# Patient Record
Sex: Female | Born: 1970 | Race: White | Hispanic: No | Marital: Married | Smoking: Never smoker
Health system: Southern US, Community
[De-identification: ages and names within clinical notes are randomized; demographics above are authoritative.]

## PROBLEM LIST (undated history)

## (undated) DIAGNOSIS — I10 Essential (primary) hypertension: Secondary | ICD-10-CM

## (undated) HISTORY — DX: Essential (primary) hypertension: I10

## (undated) HISTORY — PX: KIDNEY SURGERY: SHX687

---

## 1998-02-09 ENCOUNTER — Other Ambulatory Visit: Admission: RE | Admit: 1998-02-09 | Discharge: 1998-02-09 | Payer: Self-pay | Admitting: Obstetrics and Gynecology

## 1999-01-11 ENCOUNTER — Other Ambulatory Visit: Admission: RE | Admit: 1999-01-11 | Discharge: 1999-01-11 | Payer: Self-pay | Admitting: Obstetrics and Gynecology

## 1999-09-09 ENCOUNTER — Inpatient Hospital Stay (HOSPITAL_COMMUNITY): Admission: AD | Admit: 1999-09-09 | Discharge: 1999-09-09 | Payer: Self-pay | Admitting: Obstetrics and Gynecology

## 1999-09-11 ENCOUNTER — Encounter (INDEPENDENT_AMBULATORY_CARE_PROVIDER_SITE_OTHER): Payer: Self-pay | Admitting: Specialist

## 1999-09-11 ENCOUNTER — Inpatient Hospital Stay (HOSPITAL_COMMUNITY): Admission: AD | Admit: 1999-09-11 | Discharge: 1999-09-15 | Payer: Self-pay | Admitting: Obstetrics and Gynecology

## 1999-09-16 ENCOUNTER — Encounter: Admission: RE | Admit: 1999-09-16 | Discharge: 1999-12-15 | Payer: Self-pay | Admitting: Gynecology

## 1999-11-08 ENCOUNTER — Other Ambulatory Visit: Admission: RE | Admit: 1999-11-08 | Discharge: 1999-11-08 | Payer: Self-pay | Admitting: Gynecology

## 2000-12-06 ENCOUNTER — Other Ambulatory Visit: Admission: RE | Admit: 2000-12-06 | Discharge: 2000-12-06 | Payer: Self-pay | Admitting: Obstetrics and Gynecology

## 2001-07-25 ENCOUNTER — Other Ambulatory Visit: Admission: RE | Admit: 2001-07-25 | Discharge: 2001-07-25 | Payer: Self-pay | Admitting: Gynecology

## 2001-07-30 ENCOUNTER — Encounter: Admission: RE | Admit: 2001-07-30 | Discharge: 2001-10-28 | Payer: Self-pay | Admitting: Gynecology

## 2002-02-12 ENCOUNTER — Inpatient Hospital Stay (HOSPITAL_COMMUNITY): Admission: AD | Admit: 2002-02-12 | Discharge: 2002-02-14 | Payer: Self-pay | Admitting: Internal Medicine

## 2002-02-16 ENCOUNTER — Encounter: Admission: RE | Admit: 2002-02-16 | Discharge: 2002-03-18 | Payer: Self-pay | Admitting: Gynecology

## 2002-03-19 ENCOUNTER — Encounter: Admission: RE | Admit: 2002-03-19 | Discharge: 2002-04-18 | Payer: Self-pay | Admitting: Gynecology

## 2002-03-24 ENCOUNTER — Other Ambulatory Visit: Admission: RE | Admit: 2002-03-24 | Discharge: 2002-03-24 | Payer: Self-pay | Admitting: *Deleted

## 2003-11-03 ENCOUNTER — Other Ambulatory Visit: Admission: RE | Admit: 2003-11-03 | Discharge: 2003-11-03 | Payer: Self-pay | Admitting: Obstetrics and Gynecology

## 2004-11-10 ENCOUNTER — Other Ambulatory Visit: Admission: RE | Admit: 2004-11-10 | Discharge: 2004-11-10 | Payer: Self-pay | Admitting: Obstetrics and Gynecology

## 2005-11-15 ENCOUNTER — Other Ambulatory Visit: Admission: RE | Admit: 2005-11-15 | Discharge: 2005-11-15 | Payer: Self-pay | Admitting: Obstetrics and Gynecology

## 2006-11-18 ENCOUNTER — Other Ambulatory Visit: Admission: RE | Admit: 2006-11-18 | Discharge: 2006-11-18 | Payer: Self-pay | Admitting: Obstetrics and Gynecology

## 2007-11-25 ENCOUNTER — Other Ambulatory Visit: Admission: RE | Admit: 2007-11-25 | Discharge: 2007-11-25 | Payer: Self-pay | Admitting: Obstetrics and Gynecology

## 2008-12-14 ENCOUNTER — Ambulatory Visit: Payer: Self-pay | Admitting: Obstetrics and Gynecology

## 2008-12-14 ENCOUNTER — Encounter: Payer: Self-pay | Admitting: Obstetrics and Gynecology

## 2008-12-14 ENCOUNTER — Other Ambulatory Visit: Admission: RE | Admit: 2008-12-14 | Discharge: 2008-12-14 | Payer: Self-pay | Admitting: Obstetrics and Gynecology

## 2009-12-20 ENCOUNTER — Ambulatory Visit: Payer: Self-pay | Admitting: Obstetrics and Gynecology

## 2009-12-20 ENCOUNTER — Other Ambulatory Visit: Admission: RE | Admit: 2009-12-20 | Discharge: 2009-12-20 | Payer: Self-pay | Admitting: Obstetrics and Gynecology

## 2010-11-24 NOTE — H&P (Signed)
   Teresa Fitzpatrick, Teresa Fitzpatrick                        ACCOUNT NO.:  [id number]   MEDICAL RECORD NO.:  [id number]                   PATIENT TYPE:  INP   LOCATION:  9161                                 FACILITY:  [facility]   PHYSICIAN:  Devin M. Ciliberti, M.D.            DATE OF BIRTH:  03/21/1971   DATE OF ADMISSION:  02/12/2002  DATE OF DISCHARGE:                                HISTORY & PHYSICAL   HISTORY OF PRESENT ILLNESS:  The patient is a 40 year old G3 P1 at 54 and  six-sevenths weeks who presents to triage in labor.  The patient was noted  to be 5 cm, ruptured, and contracting every two minutes.  The patient has a  history of chronic hypertension, was on Aldomet 500 mg t.i.d. and has been  monitored with stress tests and fluid checks throughout her third trimester  with good results.   PAST MEDICAL HISTORY:  Significant for reimplantation of her ureters at age  [age], and hypertension.   PAST SURGICAL HISTORY:  Ureteral implantation, D&C, and C section for  failure to progress.   MEDICATIONS:  Prenatal vitamins.   ALLERGIES:  No known drug allergies   SOCIAL HISTORY:  Denied any alcohol, tobacco, or other drugs.   FAMILY HISTORY:  Without any mental retardation, epithelial cancers.   PHYSICAL EXAMINATION:  VITAL SIGNS:  Blood pressure 120/78.   HEENT:  Throat clear.   LUNGS:  Clear to auscultation bilaterally.   HEART:  Regular rate and rhythm.   ABDOMEN:  Gravid and nontender.  Estimated fetal weight 8 pounds 1 ounce.   PELVIC:  Cervix complete, +1.  The patient has started pushing.   LABORATORY DATA:  GBS is positive.  Will start clindamycin 900 mg IV q.8h.                                               Devin M. Ciliberti, M.D.    DMC/MEDQ  D:  02/12/2002  T:  02/12/2002  Job:  [id number]

## 2010-11-24 NOTE — Op Note (Signed)
[facility]  Patient:    Teresa Fitzpatrick, Teresa Fitzpatrick                       MRN: [id number] Proc. Date: 09/12/99 Adm. Date:  [id number] Attending:  Merrily Pew                           Operative Report  PREOPERATIVE DIAGNOSIS:       Term intrauterine pregnancy.  Polyhydramnios. Chronic hypertension with superimposed preeclampsia.  Arrest of first stage of labor.  Persistent low fetal heart rate.  History of duplicating left collecting system and also history of bilateral ureteral reimplantations.  POSTOPERATIVE DIAGNOSIS:      Term intrauterine pregnancy.  Polyhydramnios. Chronic hypertension with superimposed preeclampsia.  Arrest of first stage of labor.  Persistent low fetal heart rate.  History of duplicating left collecting system and also history of bilateral ureteral reimplantations.  OPERATION:                    Primary low transverse cesarean section.  SURGEON:                      Juan H. Lily Peer, M.D.  ASSISTANT:                    Douglass Rivers, M.D.  ANESTHESIA:                   Epidural.  FINDINGS:                     Clear amniotic fluid.  Viable female infant, Apgars 9 and 9, with arterial cord pH of 7.25, with a weight of 8 pounds 14 ounces.  ESTIMATED BLOOD LOSS:  INDICATIONS:                  A 40 year old, gravida 2, para 0, abortus 1, with  chronic hypertension with superimposed preeclampsia, polyhydramnios, at term with arrest of first stage of labor, and a nonreassuring fetal heart rate tracing (low fetal heart rate baseline 105 to 110 beat per minute range).  DESCRIPTION OF PROCEDURE:     After the patient was adequately counseled, she was taken to the operating room where she had already had an epidural placed during her labor and she was redosed and placed in the supine position.  Fetal heart tones  were appreciated at 105 beats per minute.  Scalp electrode was removed as well s the intrauterine pressure  catheter.  The Foley catheter was draining well.  The  abdomen was prepped and draped in the usual sterile fashion.  A Pfannenstiel skin incision was made 2 to 3 cm above the previous transabdominal scar that was present from her previous surgery.  The incision was carried down from the skin and subcutaneous tissue down to the rectus fascia whereby a midline nick was made. The fascia was incised in a transverse fashion.  The midline raphe was entered. The peritoneal cavity was entered cautiously.  The bladder flap was established and the lower uterine segment was incised in a transverse fashion.  Clear amniotic fluid was present.  The newborn was delivered.  The nasopharyngeal area was bulb suctioned.  The cord was doubly clamped and excised.  The newborn gave an immediate cry, was shown to the parents, and passed off to the pediatricians who were in attendance.  After cord blood  was obtained, the placenta was delivered from the  intrauterine cavity and submitted for histological evaluation.  The uterus was ot exteriorized and the uterus after clearing the remaining products of conception was closed in a single layer fashion with 0 Vicryl suture.  The pelvic cavity was copiously irrigated with normal saline solution.  Sponge, needle, and instrument counts were correct.  The visceroperitoneum was not reapproximated.  The rectus  fascia was closed with a running stitch of 0 Vicryl suture.  The subcutaneous bleeders were bovie cauterized.  The skin was reapproximated with skin clips followed by placement of Xeroform gauze and 4 x 8 dressing.  The patient was transferred to the recovery room with stable vital signs.  Estimated blood loss  from the procedure was 800 cc.  Fluid resuscitation consisted of 900 cc of Ringers lactate.  Urine output was 600 cc and clear and she had received 1 gram of Cefotan prophylactically. DD:  09/12/99 TD:  09/13/99 Job: [id number]

## 2010-11-24 NOTE — H&P (Signed)
NAMETEONNA, COONAN                        ACCOUNT NO.:  [id number]   MEDICAL RECORD NO.:  [id number]                   PATIENT TYPE:  INP   LOCATION:  NA                                   [facility]ACILITY:  [facility]   PHYSICIAN:  Ivor Costa. Farrel Gobble, M.D.              DATE OF BIRTH:  05/07/1971   DATE OF ADMISSION:  02/09/2002  DATE OF DISCHARGE:                                HISTORY & PHYSICAL   CHIEF COMPLAINT:  Previous cesarean section for elective repeat.   HISTORY OF PRESENT ILLNESS:  The patient is a 40 year old G3 P1 0-1-1 with  an LMP of 05/05/01.  Estimated date of confinement of 02/09/02.  Estimated  gestational age is 80 and 4/7 weeks with a history of a previous cesarean  section done for chronic hypertension with superimposed preeclampsia,  nonreassuring fetal heart rate tracing, who presents at this point for an  elective repeat C-section.  The patient was hoping for a VBAC trial but was  not interested in another induction of labor.  Her pregnancy was complicated  by chronic hypertension for which she had been followed in the office with  modified biophysical profiles for 32 weeks, all of which had been assuring.  Also previous history of preeclampsia which has not recurred in the current  pregnancy.   LABORATORY DATA:  Past two-week prenatal laboratory:  She shows positive  antibody negative while pure and nonreactive.  Rubella immune, hepatitis B  surface antigen nonreactive.  HIV nonreactive.  GBS positive.   PAST OB/GYN HISTORY:  Remarkable for cesarean section as above.  Regular  periods, normal Pap smear.   PAST MEDICAL HISTORY:  Significant for chronic hypertension.   PAST SURGICAL HISTORY:  Cesarean section, 2001, as above.  D&C, 1988.   MEDICATIONS:  Prenatal vitamins and methyldopa 500 mg t.i.d.   ALLERGIES:  PENICILLIN and SULFA.   PHYSICAL EXAMINATION:  GENERAL:  She is a gravida female in no acute  distress.  HEART:  Regular rate.  LUNGS:  Clear to  auscultation.  BREASTS:  Without mass, discharge, or retractions.  ABDOMEN:  Gravid, soft, nontender.  Fetal heart tones auscultated.  VAGINAL EXAM:  She is fingertip 50% __________.   LABORATORY DATA:  ULTRASOUND:  Vertex presentation with estimated fetal  weight of 38.08 gm and a normal AFI of 13.9.   ASSESSMENT:  Forty and four-sevenths weeks for elective repeat cesarean  section.  The patient failed to enter spontaneous labor and was not  desirable of induction.  All questions were addressed and she will present  in the morning of August 7th for surgery.                                              Ivor Costa. Farrel Gobble, M.D.   THL/MEDQ  D:  02/11/2002  T:  02/11/2002  Job:  [id number]

## 2010-11-24 NOTE — Discharge Summary (Signed)
[facility]  Patient:    Teresa, Fitzpatrick                       MRN: [id number] Adm. Date:  [id number] Disc. Date: [id number] Attending:  Merrily Pew Dictator:   Antony Contras, RNC, [facility]                           Discharge Summary  FINAL DIAGNOSES:              1. Intrauterine pregnancy at term.                               2. History of chronic hypertension.                               3. Polyhydramnios.                               4. Low transverse cesarean section delivery of a                                  viable female infant.  HISTORY OF PRESENT ILLNESS:   The patient is a 40 year old, gravida 2, para 1, abortus 1, with an EDC of September 14, 1999.  Prenatal course has been significant or chronic hypertension.  The patient has been on Aldamet 500 mg t.i.d.  She had a  24-hour total protein, creatinine clearance early in the pregnancy, was watched  carefully throughout the pregnancy, and in the third trimester began having weekly antepartum testing consisting of NST and biophysical profiles and then every two weeks ultrasound measurements of the fetus for weight.  The most recent biophysical profile on March 5, revealed AFI in the 97th percentile with an AFI of 28.9. Fetal weight was between 3747 and 3818 grams.  On office visit on September 11, 1999, the patients blood pressure was 152/100, but when she was placed on her left side it went down to 132/170, no visual disturbances or right upper quadrant pain.  She did have 1 to 2+ pitting edema with DTRs 1+ without clonus.  Her cervix was a fingertip, 50% effaced, and ballotable.  LABORATORY DATA:              Blood type O positive, negative antibody screen. VDRL, hepatitis B surface antigen, and HIV negative.  Rubella titer with no evidence of immunity.  She had declined MSAFP testing.  She had a negative diabetic screen.  Group B Strep status was also negative.  The plan was  for admission on the evening of March 5, where Cervidil would be placed as a cervical ripening agent  with initiation of Pitocin on the morning of March 6.  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  HOSPITAL COURSE:              The patient was admitted for induction on September 12, 1999.  Artificial rupture of membranes revealed clear fluid.  IUPC and scalp electrodes were placed.  PIH panel revealed elevated SGOT and LDH.  A magnesium  sulfate  drip 2 grams per hour was initiated.  She did develop a nonreassuring fetal heart rate tracing when she reached 4 cm and it was decided to proceed with cesarean section delivery, low transverse cesarean section was performed by Gaetano Hawthorne. Lily Peer, M.D. and assisted by Douglass Rivers, M.D. under epidural anesthesia. The patient was delivered of an Apgars 9 and 72 female infant, weight was 8 pounds 14 ounces.  Cord blood pH 7.25.  Estimated blood loss was 800 cc. Postoperatively, systolic blood pressures were in the 140s, diastolics in the 80s.  Her urine output was 1900 cc 8 hours averaging about 400 to 500 cc per hour.  Uric acid was 8.1,  magnesium level 5.4, SGOT 46, sodium 132, CBC was hemoglobin 10.1, hematocrit 30.0, platelets 146, WBC 10.3.  She continued to respond to treatment with magnesium rip was able to be discontinued on March 7.  She was restarted on her Aldamet.  She  remained afebrile and had no difficulty voiding.  On September 14, 1999, SGOT was 54, SGPT 38, uric acid 8.8.  Postpartum CBC; hematocrit 24.4, hemoglobin 8.3, platelets 137, WBC 9.7.  She was able to be discharged on March 9 which was her third postoperative day.  She did receive rubella vaccine prior to discharge and at that time was in satisfactory condition.  DISPOSITION:                  The patient will be followed up in the office in ix weeks.  She is to  continue on her Aldamet 500 mg p.o. t.i.d., Tylox one to two .o. p.r.n., prenatal vitamins. DD:  09/29/99 TD:  09/29/99 Job: [id number]

## 2010-11-24 NOTE — Discharge Summary (Signed)
   NAMEJANARIA, Teresa Fitzpatrick                        ACCOUNT NO.:  [id number]   MEDICAL RECORD NO.:  [id number]                   PATIENT TYPE:  INP   LOCATION:  NA                                   [facility]   PHYSICIAN:  Timothy P. Fontaine, M.D.           DATE OF BIRTH:  1971-05-08   DATE OF ADMISSION:  02/12/2002  DATE OF DISCHARGE:  02/14/2002                                 DISCHARGE SUMMARY   DISCHARGE DIAGNOSES:  Intrauterine pregnancy 39+ weeks delivered, history of  prior low transverse cesarean section, desired trial of labor, successful  vaginal birth after cesarean section, status post spontaneous vaginal  delivery by Dr. Katy Fitch on February 12, 2002, chronic hypertension,  anemia, patient's personal history of duplication of a left collecting  system.   HISTORY:  This is a 40 year old female gravida 3, para 1 with EDC of February 09, 2002.  Prenatal course had been complicated by chronic hypertension on  Aldomet.  She also had a history of bilateral uretal reimplantation at age 1  for vesicoureteral reflux.  Also had a personal history of duplication of  the left collecting system and she had a history of preeclampsia with the  last pregnancy.  She had a prior lower uterine transverse cesarean section,  but desired trial of labor.   HOSPITAL COURSE:  On February 12, 2002 patient was admitted in 39+ weeks in  labor and subsequently underwent spontaneous vaginal delivery on February 12, 2002 by Dr. Katy Fitch at [phone] of a female, Apgars 9 and 9, weight of 7  pounds 9 ounces.  There was a second degree laceration and several  superficial tears.  Postpartum patient remained afebrile, voiding, stable  condition.  Her blood pressure remained within normal limits.  Her  hemoglobin was 7.9, however, patient was asymptomatic.  The patient was felt  satisfactory for condition, therefore was discharged to home on February 14, 2002.  Given [facility] Gynecology postpartum  instruction/postpartum  booklet.   ACCESSORY CLINICAL FINDINGS:  Laboratories:  The patient is O+.  Rubella  immune.  On February 13, 2002 hemoglobin 7.9 with hematocrit 23.5.   DISPOSITION:  The patient is discharged to home.  She is to recheck her  blood pressure in the office that week.  She is to take iron daily, Tylox  __________ p.r.n. pain.     Teresa Fitzpatrick, P.A.                    Timothy P. Audie Box, M.D.    Ardath Sax  D:  03/13/2002  T:  03/13/2002  Job:  [id number]

## 2010-12-26 ENCOUNTER — Other Ambulatory Visit: Payer: Self-pay | Admitting: Obstetrics and Gynecology

## 2010-12-26 ENCOUNTER — Encounter (INDEPENDENT_AMBULATORY_CARE_PROVIDER_SITE_OTHER): Payer: 59 | Admitting: Obstetrics and Gynecology

## 2010-12-26 ENCOUNTER — Other Ambulatory Visit (HOSPITAL_COMMUNITY)
Admission: RE | Admit: 2010-12-26 | Discharge: 2010-12-26 | Disposition: A | Discharge status: Home / Self Care | Payer: 59 | Source: Ambulatory Visit | Attending: Obstetrics and Gynecology | Admitting: Obstetrics and Gynecology

## 2010-12-26 DIAGNOSIS — Z01419 Encounter for gynecological examination (general) (routine) without abnormal findings: Secondary | ICD-10-CM

## 2010-12-26 DIAGNOSIS — Z1231 Encounter for screening mammogram for malignant neoplasm of breast: Secondary | ICD-10-CM

## 2010-12-26 DIAGNOSIS — R82998 Other abnormal findings in urine: Secondary | ICD-10-CM

## 2010-12-26 DIAGNOSIS — Z124 Encounter for screening for malignant neoplasm of cervix: Secondary | ICD-10-CM | POA: Insufficient documentation

## 2010-12-29 ENCOUNTER — Ambulatory Visit (HOSPITAL_COMMUNITY)
Admission: RE | Admit: 2010-12-29 | Discharge: 2010-12-29 | Disposition: A | Discharge status: Home / Self Care | Payer: 59 | Source: Ambulatory Visit | Attending: Obstetrics and Gynecology | Admitting: Obstetrics and Gynecology

## 2010-12-29 DIAGNOSIS — Z1231 Encounter for screening mammogram for malignant neoplasm of breast: Secondary | ICD-10-CM | POA: Insufficient documentation

## 2011-12-27 ENCOUNTER — Encounter: Payer: Self-pay | Admitting: Obstetrics and Gynecology

## 2011-12-27 ENCOUNTER — Ambulatory Visit (INDEPENDENT_AMBULATORY_CARE_PROVIDER_SITE_OTHER): Payer: BC Managed Care – PPO | Admitting: Obstetrics and Gynecology

## 2011-12-27 VITALS — BP 108/78 | Ht 66.0 in | Wt 242.0 lb

## 2011-12-27 DIAGNOSIS — N920 Excessive and frequent menstruation with regular cycle: Secondary | ICD-10-CM

## 2011-12-27 DIAGNOSIS — Z01419 Encounter for gynecological examination (general) (routine) without abnormal findings: Secondary | ICD-10-CM

## 2011-12-27 NOTE — Progress Notes (Signed)
Patient came to see me today for her annual GYN exam. Her husband has had a vasectomy. Her cycles remain long at 7 days but they have now gotten extremely heavy for 1-2 days. She has to go to the bathroom very very frequently and is passing large clots. 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 She is having no pelvic pain. She had a normal mammogram last year. She does her lab work through her PCP. Patient has always had normal Pap smears and last one was in 2012.  Physical examination: Sherrilyn Rist present. HEENT within normal limits. Neck: Thyroid not large. No masses. Supraclavicular nodes: not enlarged. Breasts: Examined in both sitting and lying  position. No skin changes and no masses. Abdomen: Soft no guarding rebound or masses or hernia. Pelvic: External: Within normal limits. BUS: Within normal limits. Vaginal:within normal limits. Good estrogen effect. No evidence of cystocele rectocele or enterocele. Cervix: clean. Uterus: Normal size and shape. Adnexa: No masses. Rectovaginal exam: Confirmatory and negative. Extremities: Within normal limits.  Assessment: Severe menorrhagia  Plan: Endometrial biopsy done. Patient to check with PCP to be sure she had a CBC. If not we will do one here. Discussed endometrial ablation or [facility] IUD. Information given. Mammogram. Discussed success rate with ablation. Patient will decide and inform. No Pap done. She favors ablation and I conclude.

## 2011-12-28 LAB — URINALYSIS W MICROSCOPIC + REFLEX CULTURE
Bacteria, UA: NONE SEEN
Bilirubin Urine: NEGATIVE
Casts: NONE SEEN
Crystals: NONE SEEN
Glucose, UA: NEGATIVE mg/dL
Hgb urine dipstick: NEGATIVE
Ketones, ur: NEGATIVE mg/dL
Leukocytes, UA: NEGATIVE
Nitrite: NEGATIVE
Protein, ur: NEGATIVE mg/dL
Specific Gravity, Urine: 1.019 (ref 1.005–1.030)
Squamous Epithelial / LPF: NONE SEEN
Urobilinogen, UA: 0.2 mg/dL (ref 0.0–1.0)
pH: 6.5 (ref 5.0–8.0)

## 2012-01-02 ENCOUNTER — Telehealth: Payer: Self-pay | Admitting: Obstetrics and Gynecology

## 2012-01-02 NOTE — Telephone Encounter (Signed)
Left message patient to call me as I have checked her insurance benefits for Her Option Endometrial ablation.

## 2012-01-04 ENCOUNTER — Telehealth: Payer: Self-pay | Admitting: Obstetrics and Gynecology

## 2012-01-04 NOTE — Telephone Encounter (Signed)
I spoke with patient regarding Her Option Ablation is a covered benefit with her insurance with a $35 copayment then it pays 100%.  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  She anticipates her period this weekend or first of next week but sometimes it can go a little longer between cycles. It usually lasts about 5-7 days.  I am holding time for her on July 10, 8:30am for procedure and July 8, 4:00pm for consult with Dr. Reece Agar.  She is going to call me when her period starts and I will e-scribe her Prometrium and Cytotec. She does know she needs to start Prometrium Day 3 of cycle so if her period does indeed start this weekend she will call me first thing Monday to get RX in.

## 2012-01-07 ENCOUNTER — Telehealth: Payer: Self-pay | Admitting: Obstetrics and Gynecology

## 2012-01-07 ENCOUNTER — Other Ambulatory Visit: Payer: Self-pay | Admitting: Obstetrics and Gynecology

## 2012-01-07 DIAGNOSIS — N920 Excessive and frequent menstruation with regular cycle: Secondary | ICD-10-CM

## 2012-01-07 MED ORDER — PROGESTERONE MICRONIZED 200 MG PO CAPS: ORAL_CAPSULE | ORAL | Status: DC

## 2012-01-07 MED ORDER — MISOPROSTOL 200 MCG PO TABS: ORAL_TABLET | ORAL | Status: DC

## 2012-01-07 NOTE — Telephone Encounter (Signed)
Patient called. Menses began yesterday. Needs Prometrium called in and she knows I am going to go ahead and call her Cytotec tab in to be used vaginally night before surgery.  Her Option Ablation is scheduled for July 10 at 8:30am and she will consult with Dr. Reece Agar on Monday, July 8, 4:00pm. Instruction sheet with dates mailed to patient.

## 2012-01-07 NOTE — Telephone Encounter (Signed)
Both Rx's have been e-scribed.

## 2012-01-14 ENCOUNTER — Ambulatory Visit (INDEPENDENT_AMBULATORY_CARE_PROVIDER_SITE_OTHER): Payer: BC Managed Care – PPO | Admitting: Obstetrics and Gynecology

## 2012-01-14 DIAGNOSIS — N920 Excessive and frequent menstruation with regular cycle: Secondary | ICD-10-CM

## 2012-01-14 MED ORDER — AZITHROMYCIN 500 MG PO TABS: 500.0000 mg | ORAL_TABLET | Freq: Every day | ORAL | Status: AC

## 2012-01-14 MED ORDER — DIAZEPAM 10 MG PO TABS: 10.0000 mg | ORAL_TABLET | Freq: Four times a day (QID) | ORAL | Status: AC | PRN

## 2012-01-14 NOTE — Progress Notes (Signed)
The patient came back today and we discussed her menorrhagia. She bleeds for 7 days. Four of  those days are very heavy. The first day she changes a tampon hourly. 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 We rediscussed endometrial ablation. We discussed success rate. We discussed complications. She signed an operative permit. I answered all her questions. We called in her prescriptions including Valium, Zithromax, and Tylox. She already has her Cytotec. Total time of consult was 30 minutes.

## 2012-01-16 ENCOUNTER — Ambulatory Visit (INDEPENDENT_AMBULATORY_CARE_PROVIDER_SITE_OTHER): Payer: BC Managed Care – PPO | Admitting: Obstetrics and Gynecology

## 2012-01-16 ENCOUNTER — Ambulatory Visit (INDEPENDENT_AMBULATORY_CARE_PROVIDER_SITE_OTHER): Payer: BC Managed Care – PPO

## 2012-01-16 VITALS — BP 120/76 | HR 90

## 2012-01-16 VITALS — BP 120/80 | HR 72

## 2012-01-16 DIAGNOSIS — N920 Excessive and frequent menstruation with regular cycle: Secondary | ICD-10-CM

## 2012-01-16 DIAGNOSIS — N949 Unspecified condition associated with female genital organs and menstrual cycle: Secondary | ICD-10-CM

## 2012-01-16 DIAGNOSIS — R102 Pelvic and perineal pain: Secondary | ICD-10-CM

## 2012-01-16 MED ORDER — KETOROLAC TROMETHAMINE 30 MG/ML IJ SOLN
60.0000 mg | Freq: Once | INTRAMUSCULAR | Status: AC
  Administered 2012-01-16: 60 mg via INTRAMUSCULAR

## 2012-01-16 MED ORDER — LIDOCAINE HCL 1 % IJ SOLN
20.0000 mL | Freq: Once | INTRAMUSCULAR | Status: AC
  Administered 2012-01-16: 20 mL

## 2012-01-16 NOTE — Progress Notes (Signed)
Her Option Procedural Note Patient:  Teresa Fitzpatrick                                                   Patient ID [id number] Date of procedure: 01/16/2012 Diagnoses:menorrhagia Procedure: Endometrial cryoablation with intraoperative ultrasonic guidance.  Procedure: The patient was brought to the treatment room having previously been counseled for the procedure and having signed the consent form. The patient was placed in the dorsolithotomy position and a speculum was inserted. The cervix and vagina were cleaned with Betadine. A single-tooth tenaculum was placed on the anterior lip of the cervix. A paracervical block was placed with 20 cc 1% plain Xylocaine. The uterus was sounded to   7  Centimeters. Under ultrasonic guidance the her option probe was introduced into the uterine cavity after the pre-procedural sequence was performed. After assuring proper corneal placement cryoablation was then performed under continuous ultrasound guidance monitoring the growth of the cryozone. Sequential cryoablation were performed in the following order, locations, freeze times and post freeze myometrial depths. Between the first and second freeze her bladder was partially emptied with a small catheter as it gotten too distended.           Location of freeze             Length of time         Myometrial depth 1.         Right cornual                              6  minutes                  7.6     Millimeters 2.         Left cornual                                 6  Minutes                   4.3 millimeters                3.  Upon completion of the procedure, the instruments were removed, hemostasis visualized and the patient was assisted to the bathroom and then another exam room where she was observed.  The patient tolerated the procedure well and was released in stable condition with her driver along with a copy of the post procedure instructions which were reviewed with her. She is to return to the office  in 2 weeks for a post procedure check.

## 2012-01-23 ENCOUNTER — Telehealth: Payer: Self-pay | Admitting: *Deleted

## 2012-01-23 NOTE — Telephone Encounter (Signed)
Patient called with questions about discharge she will be having since Her Option.  Answered all questions.

## 2012-02-06 ENCOUNTER — Ambulatory Visit (INDEPENDENT_AMBULATORY_CARE_PROVIDER_SITE_OTHER): Payer: BC Managed Care – PPO | Admitting: Obstetrics and Gynecology

## 2012-02-06 DIAGNOSIS — N920 Excessive and frequent menstruation with regular cycle: Secondary | ICD-10-CM

## 2012-02-06 NOTE — Progress Notes (Signed)
Patient came to see me today for a postoperative visit after endometrial ablation. She is doing well without any problems. She is not have her cycle yet since her surgery.  Exam: Kennon Portela present.Pelvic exam: External within normal limits. BUS within normal limits. Vaginal exam within normal limits. Cervix is clean without lesions. Uterus is normal size and shape. Adnexa failed to reveal masses. Rectovaginal examination is confirmatory and without masses.   Assessment: Normal postoperative exam  Plan: Patient reassured. She will keep me updated. Return in one year for annual exam.

## 2013-02-09 ENCOUNTER — Ambulatory Visit (INDEPENDENT_AMBULATORY_CARE_PROVIDER_SITE_OTHER): Payer: BC Managed Care – PPO | Admitting: Women's Health

## 2013-02-09 ENCOUNTER — Encounter: Payer: Self-pay | Admitting: Women's Health

## 2013-02-09 VITALS — BP 120/70 | Ht 66.0 in | Wt 220.0 lb

## 2013-02-09 DIAGNOSIS — Z01419 Encounter for gynecological examination (general) (routine) without abnormal findings: Secondary | ICD-10-CM

## 2013-02-09 DIAGNOSIS — I10 Essential (primary) hypertension: Secondary | ICD-10-CM | POA: Insufficient documentation

## 2013-02-09 NOTE — Progress Notes (Signed)
MEKAELA AZIZI 23-Feb-1971 [id number]    History:    The patient presents for annual exam.  Llight monthly cycle, her option 2013 with good results. Vasectomy. Normal Pap and mammogram history. Hypertension primary care manages. Parents hypertension.  Past medical history, past surgical history, family history and social history were all reviewed and documented in the EPIC chart. 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   ROS:  A  ROS was performed and pertinent positives and negatives are included in the history.  Exam:  Filed Vitals:   02/09/13 1517  BP: 120/70    General appearance:  Normal Head/Neck:  Normal, without cervical or supraclavicular adenopathy. Thyroid:  Symmetrical, normal in size, without palpable masses or nodularity. Respiratory  Effort:  Normal  Auscultation:  Clear without wheezing or rhonchi Cardiovascular  Auscultation:  Regular rate, without rubs, murmurs or gallops  Edema/varicosities:  Not grossly evident Abdominal  Soft,nontender, without masses, guarding or rebound.  Liver/spleen:  No organomegaly noted  Hernia:  None appreciated  Skin  Inspection:  Grossly normal  Palpation:  Grossly normal Neurologic/psychiatric  Orientation:  Normal with appropriate conversation.  Mood/affect:  Normal  Genitourinary    Breasts: Examined lying and sitting.     Right: Without masses, retractions, discharge or axillary adenopathy.     Left: Without masses, retractions, discharge or axillary adenopathy.   Inguinal/mons:  Normal without inguinal adenopathy  External genitalia:  Normal  BUS/Urethra/Skene's glands:  Normal  Bladder:  Normal  Vagina:  Normal  Cervix:  Normal  Uterus:   normal in size, shape and contour.  Midline and mobile  Adnexa/parametria:     Rt: Without masses or tenderness.   Lt: Without masses or tenderness.  Anus and perineum: Normal  Digital rectal exam: Normal sphincter tone without palpated masses or  tenderness  Assessment/Plan:  42 y.o. M. WF G2 P2 for annual exam with no complaints.  Hypertension labs and meds primary care Good relief of menorrhagia with her option obesity  Plan: Continue healthy diet with exercise for continued weight loss. SBE's, annual mammogram, overdue instructed to schedule. Pap, normal Pap 2012, new screening guidelines reviewed.   Harrington Challenger WHNP, 5:00 PM 02/09/2013

## 2013-02-09 NOTE — Patient Instructions (Addendum)

## 2013-02-18 ENCOUNTER — Encounter: Payer: Self-pay | Admitting: *Deleted

## 2013-03-11 ENCOUNTER — Ambulatory Visit: Payer: BC Managed Care – PPO | Admitting: General Surgery

## 2013-03-30 ENCOUNTER — Ambulatory Visit (INDEPENDENT_AMBULATORY_CARE_PROVIDER_SITE_OTHER): Payer: BC Managed Care – PPO | Admitting: General Surgery

## 2013-03-30 ENCOUNTER — Encounter: Payer: Self-pay | Admitting: General Surgery

## 2013-03-30 VITALS — BP 140/90 | HR 84 | Resp 12 | Ht 66.0 in | Wt 216.0 lb

## 2013-03-30 DIAGNOSIS — R2232 Localized swelling, mass and lump, left upper limb: Secondary | ICD-10-CM

## 2013-03-30 DIAGNOSIS — D2360 Other benign neoplasm of skin of unspecified upper limb, including shoulder: Secondary | ICD-10-CM

## 2013-03-30 DIAGNOSIS — D171 Benign lipomatous neoplasm of skin and subcutaneous tissue of trunk: Secondary | ICD-10-CM

## 2013-03-30 NOTE — Progress Notes (Signed)
Patient ID: Teresa Fitzpatrick, female   DOB: [birth year], 42 y.o.   MRN: [id number]  Chief Complaint  Patient presents with  . Rectal Pain    subcutanous    HPI Teresa Fitzpatrick is a 42 y.o. female here today for an evaluation of subcutaneous mass on her left shoulder. She states she had it for an year now. If she lays on it hurts her. She thinks its getting bigger.  The patient is a middle school Editor, commissioning at [employer].  Marland KitchenHPI  Past Medical History  Diagnosis Date  . Hypertension     Past Surgical History  Procedure Laterality Date  . Cesarean section    . Kidney surgery      REFLUX (REPAIR)    Family History  Problem Relation Age of Onset  . Hypertension Mother   . Hypertension Father   . Heart disease Maternal Grandfather     Social History History  Substance Use Topics  . Smoking status: Never Smoker   . Smokeless tobacco: Never Used  . Alcohol Use: Yes     Comment: 1-2 week    Allergies  Allergen Reactions  . Penicillins   . Sulfa Antibiotics     Current Outpatient Prescriptions  Medication Sig Dispense Refill  . Cetirizine HCl (ZYRTEC PO) Take by mouth. Prn      . hydrochlorothiazide 25 MG tablet Take 25 mg by mouth.        Marland Kitchen KLOR-CON M20 20 MEQ tablet Take 1 tablet by mouth daily.      Marland Kitchen MAGNESIUM PO Take by mouth.        . metoprolol (TOPROL-XL) 50 MG 24 hr tablet Take 50 mg by mouth.         No current facility-administered medications for this visit.    Review of Systems Review of Systems  Constitutional: Negative.   Respiratory: Negative.   Cardiovascular: Negative.     Blood pressure 140/90, pulse 84, resp. rate 12, height 5\' 6"  (1.676 m), weight 216 lb (97.977 kg), last menstrual period 02/09/2013.  Physical Exam Physical Exam  Constitutional: She is oriented to person, place, and time. She appears well-developed and well-nourished.  Cardiovascular: Normal rate, regular rhythm and normal heart sounds.   Pulmonary/Chest:  Breath sounds normal.  Lymphadenopathy:    She has no cervical adenopathy.  Neurological: She is alert and oriented to person, place, and time.  Skin: Skin is warm and dry.  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     Plan    Options for manner there were reviewed: 1) observation versus 2) excision. The patient desired excision.  The area was cleaned with alcohol and a total of 20 cc of 0.5% Xylocaine with 0.25% Marcaine with one 200,000 units of epinephrine was utilized for local anesthesia well tolerated. Chlor prep was applied to the skin. A 6 cm incision was made over the mass and carried down through skin and subcutaneous tissue. The fascia was divided and a well-circumscribed lipoma measuring approximately 4-5 cm in diameter 1 cm in thickness was excised from the tissue just above the underlying muscle. No bleeding was noted. The wound was closed in layers with 3-0 Vicryl to the muscular fascia as well as the superficial fascia. The skin was closed with running 4-0 Vicryl subcuticular suture. Benzoin and Steri-Strips followed by Telfa Tegaderm dressing was applied. The patient  was instructed on post procedure wound care. She'll return in 8 days for wound evaluation with the nurse.        Earline Mayotte 03/30/2013, 9:57 PM

## 2013-04-01 LAB — PATHOLOGY

## 2013-04-06 ENCOUNTER — Ambulatory Visit (INDEPENDENT_AMBULATORY_CARE_PROVIDER_SITE_OTHER): Payer: BC Managed Care – PPO | Admitting: *Deleted

## 2013-04-06 DIAGNOSIS — R2230 Localized swelling, mass and lump, unspecified upper limb: Secondary | ICD-10-CM | POA: Insufficient documentation

## 2013-04-06 DIAGNOSIS — R229 Localized swelling, mass and lump, unspecified: Secondary | ICD-10-CM

## 2013-04-06 DIAGNOSIS — R2232 Localized swelling, mass and lump, left upper limb: Secondary | ICD-10-CM

## 2013-04-06 NOTE — Patient Instructions (Signed)
Patient to return as needed. 

## 2013-04-06 NOTE — Progress Notes (Signed)
Patient came in today for a wound check.  The wound is clean, with no signs of infection noted. Follow up as needed.  

## 2014-04-14 ENCOUNTER — Ambulatory Visit (INDEPENDENT_AMBULATORY_CARE_PROVIDER_SITE_OTHER): Payer: BC Managed Care – PPO | Admitting: Women's Health

## 2014-04-14 ENCOUNTER — Encounter: Payer: Self-pay | Admitting: Women's Health

## 2014-04-14 VITALS — BP 118/80 | Ht 66.0 in | Wt 252.0 lb

## 2014-04-14 DIAGNOSIS — Z23 Encounter for immunization: Secondary | ICD-10-CM

## 2014-04-14 DIAGNOSIS — Z01419 Encounter for gynecological examination (general) (routine) without abnormal findings: Secondary | ICD-10-CM

## 2014-04-14 NOTE — Progress Notes (Signed)
Vidhi Delellis Ihrig 05/09/1971 [id number]    History:    Presents for annual exam.  Light monthly cycle HER option 2013/vasectomy. Normal Pap and mammogram history. Hypertension since her 81s. Has gained 30 pounds in the past year with lifestyle.  Past medical history, past surgical history, family history and social history were all reviewed and documented in the EPIC chart. Middle school Music therapist. Dillon 14,  Faith 12 both doing well. Parents hypertension.  ROS:  A  12 point ROS was performed and pertinent positives and negatives are included.  Exam:  Filed Vitals:   04/14/14 1131  BP: 118/80    General appearance:  Normal Thyroid:  Symmetrical, normal in size, without palpable masses or nodularity. Respiratory  Auscultation:  Clear without wheezing or rhonchi Cardiovascular  Auscultation:  Regular rate, without rubs, murmurs or gallops  Edema/varicosities:  Not grossly evident Abdominal  Soft,nontender, without masses, guarding or rebound.  Liver/spleen:  No organomegaly noted  Hernia:  None appreciated  Skin  Inspection:  Grossly normal   Breasts: Examined lying and sitting.     Right: Without masses, retractions, discharge or axillary adenopathy.     Left: Without masses, retractions, discharge or axillary adenopathy. Gentitourinary   Inguinal/mons:  Normal without inguinal adenopathy  External genitalia:  Normal  BUS/Urethra/Skene's glands:  Normal  Vagina:  Normal  Cervix:  Normal  Uterus:   normal in size, shape and contour.  Midline and mobile  Adnexa/parametria:     Rt: Without masses or tenderness.   Lt: Without masses or tenderness.  Anus and perineum: Normal  Digital rectal exam: Normal sphincter tone without palpated masses or tenderness  Assessment/Plan:  43 y.o. MWF G3P2 for annual exam with no complaints.   Hypertension/primary care manages labs and meds Light monthly cycles/HER option 2013/vasectomy Obesity  Plan: Reviewed importance of healthy  lifestyle of diet and exercise for weight loss. SBE's, overdue, last mammogram 2012, reviewed importance of annual screen. Vitamin D 1000 daily encouraged. Pap normal 2014, new screening guidelines reviewed.   Huel Cote [facility], 12:03 PM 04/14/2014

## 2014-04-14 NOTE — Patient Instructions (Signed)

## 2014-05-10 ENCOUNTER — Encounter: Payer: Self-pay | Admitting: Women's Health

## 2015-01-06 ENCOUNTER — Other Ambulatory Visit: Payer: Self-pay

## 2015-04-12 ENCOUNTER — Other Ambulatory Visit: Payer: Self-pay | Admitting: Physician Assistant

## 2015-04-12 DIAGNOSIS — Z1231 Encounter for screening mammogram for malignant neoplasm of breast: Secondary | ICD-10-CM

## 2015-04-13 ENCOUNTER — Ambulatory Visit
Admission: RE | Admit: 2015-04-13 | Discharge: 2015-04-13 | Disposition: A | Discharge status: Home / Self Care | Payer: BC Managed Care – PPO | Source: Ambulatory Visit | Attending: Physician Assistant | Admitting: Physician Assistant

## 2015-04-13 DIAGNOSIS — Z1231 Encounter for screening mammogram for malignant neoplasm of breast: Secondary | ICD-10-CM | POA: Diagnosis not present

## 2016-01-19 ENCOUNTER — Encounter: Payer: Self-pay | Admitting: Women's Health

## 2016-01-19 ENCOUNTER — Ambulatory Visit (INDEPENDENT_AMBULATORY_CARE_PROVIDER_SITE_OTHER): Payer: BC Managed Care – PPO | Admitting: Women's Health

## 2016-01-19 VITALS — BP 126/80 | Ht 66.0 in | Wt 243.0 lb

## 2016-01-19 DIAGNOSIS — Z01419 Encounter for gynecological examination (general) (routine) without abnormal findings: Secondary | ICD-10-CM

## 2016-01-19 NOTE — Progress Notes (Signed)
JEANIA MARTUS 01/02/1971 [id number]    History:    Presents for annual exam.  Very light one day cycles/vasectomy/2013 her option. Denies menopausal symptoms. Normal Pap and mammogram history. Hypertension managed by primary care, parents hypertension.  Past medical history, past surgical history, family history and social history were all reviewed and documented in the EPIC chart. 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 Dillon 16, Faith 13 both doing well.  ROS:  A ROS was performed and pertinent positives and negatives are included.  Exam:  Filed Vitals:   01/19/16 0853  BP: 126/80    General appearance:  Normal Thyroid:  Symmetrical, normal in size, without palpable masses or nodularity. Respiratory  Auscultation:  Clear without wheezing or rhonchi Cardiovascular  Auscultation:  Regular rate, without rubs, murmurs or gallops  Edema/varicosities:  Not grossly evident Abdominal  Soft,nontender, without masses, guarding or rebound.  Liver/spleen:  No organomegaly noted  Hernia:  None appreciated  Skin  Inspection:  Grossly normal   Breasts: Examined lying and sitting.     Right: Without masses, retractions, discharge or axillary adenopathy.     Left: Without masses, retractions, discharge or axillary adenopathy. Gentitourinary   Inguinal/mons:  Normal without inguinal adenopathy  External genitalia:  Normal  BUS/Urethra/Skene's glands:  Normal  Vagina:  Normal  Cervix:  Normal  Uterus:   normal in size, shape and contour.  Midline and mobile  Adnexa/parametria:     Rt: Without masses or tenderness.   Lt: Without masses or tenderness.  Anus and perineum: Normal  Digital rectal exam: Normal sphincter tone without palpated masses or tenderness  Assessment/Plan:  45 y.o. MWF G3 P2 for annual exam with no complaints.  One day light monthly cycles/vasectomy/2013 her option with good relief of menorrhagia Hypertension-primary care manages labs and meds Obesity  Plan:  Reviewed 20 pound weight gain in the past year need for decreasing calories and increasing exercise for health. SBE's, continue annual screening mammogram due in October. Calcium rich diet, vitamin D 1000 daily encouraged. UA, Pap with HR HPV typing, new screening guidelines reviewed.  Huel Cote [facility], 9:37 AM 01/19/2016

## 2016-01-19 NOTE — Patient Instructions (Signed)
Health Maintenance, Female Adopting a healthy lifestyle and getting preventive care can go a long way to promote health and wellness. Talk with your health care provider about what schedule of regular examinations is right for you. This is a good chance for you to check in with your provider about disease prevention and staying healthy. In between checkups, there are plenty of things you can do on your own. Experts have done a lot of research about which lifestyle changes and preventive measures are most likely to keep you healthy. Ask your health care provider for more information. WEIGHT AND DIET  Eat a healthy diet  Be sure to include plenty of vegetables, fruits, low-fat dairy products, and lean protein.  Do not eat a lot of foods high in solid fats, added sugars, or salt.  Get regular exercise. This is one of the most important things you can do for your health.  Most adults should exercise for at least 150 minutes each week. The exercise should increase your heart rate and make you sweat (moderate-intensity exercise).  Most adults should also do strengthening exercises at least twice a week. This is in addition to the moderate-intensity exercise.  Maintain a healthy weight  Body mass index (BMI) is a measurement that can be used to identify possible weight problems. It estimates body fat based on height and weight. Your health care provider can help determine your BMI and help you achieve or maintain a healthy weight.  For females 20 years of age and older:   A BMI below 18.5 is considered underweight.  A BMI of 18.5 to 24.9 is normal.  A BMI of 25 to 29.9 is considered overweight.  A BMI of 30 and above is considered obese.  Watch levels of cholesterol and blood lipids  You should start having your blood tested for lipids and cholesterol at 47 years of age, then have this test every 5 years.  You may need to have your cholesterol levels checked more often if:  Your lipid  or cholesterol levels are high.  You are older than 47 years of age.  You are at high risk for heart disease.  CANCER SCREENING   Lung Cancer  Lung cancer screening is recommended for adults 55-80 years old who are at high risk for lung cancer because of a history of smoking.  A yearly low-dose CT scan of the lungs is recommended for people who:  Currently smoke.  Have quit within the past 15 years.  Have at least a 30-pack-year history of smoking. A pack year is smoking an average of one pack of cigarettes a day for 1 year.  Yearly screening should continue until it has been 15 years since you quit.  Yearly screening should stop if you develop a health problem that would prevent you from having lung cancer treatment.  Breast Cancer  Practice breast self-awareness. This means understanding how your breasts normally appear and feel.  It also means doing regular breast self-exams. Let your health care provider know about any changes, no matter how small.  If you are in your 20s or 30s, you should have a clinical breast exam (CBE) by a health care provider every 1-3 years as part of a regular health exam.  If you are 40 or older, have a CBE every year. Also consider having a breast X-ray (mammogram) every year.  If you have a family history of breast cancer, talk to your health care provider about genetic screening.  If you   are at high risk for breast cancer, talk to your health care provider about having an MRI and a mammogram every year.  Breast cancer gene (BRCA) assessment is recommended for women who have family members with BRCA-related cancers. BRCA-related cancers include:  Breast.  Ovarian.  Tubal.  Peritoneal cancers.  Results of the assessment will determine the need for genetic counseling and BRCA1 and BRCA2 testing. Cervical Cancer Your health care provider may recommend that you be screened regularly for cancer of the pelvic organs (ovaries, uterus, and  vagina). This screening involves a pelvic examination, including checking for microscopic changes to the surface of your cervix (Pap test). You may be encouraged to have this screening done every 3 years, beginning at age 21.  For women ages 30-65, health care providers may recommend pelvic exams and Pap testing every 3 years, or they may recommend the Pap and pelvic exam, combined with testing for human papilloma virus (HPV), every 5 years. Some types of HPV increase your risk of cervical cancer. Testing for HPV may also be done on women of any age with unclear Pap test results.  Other health care providers may not recommend any screening for nonpregnant women who are considered low risk for pelvic cancer and who do not have symptoms. Ask your health care provider if a screening pelvic exam is right for you.  If you have had past treatment for cervical cancer or a condition that could lead to cancer, you need Pap tests and screening for cancer for at least 20 years after your treatment. If Pap tests have been discontinued, your risk factors (such as having a new sexual partner) need to be reassessed to determine if screening should resume. Some women have medical problems that increase the chance of getting cervical cancer. In these cases, your health care provider may recommend more frequent screening and Pap tests. Colorectal Cancer  This type of cancer can be detected and often prevented.  Routine colorectal cancer screening usually begins at 47 years of age and continues through 47 years of age.  Your health care provider may recommend screening at an earlier age if you have risk factors for colon cancer.  Your health care provider may also recommend using home test kits to check for hidden blood in the stool.  A small camera at the end of a tube can be used to examine your colon directly (sigmoidoscopy or colonoscopy). This is done to check for the earliest forms of colorectal  cancer.  Routine screening usually begins at age 50.  Direct examination of the colon should be repeated every 5-10 years through 47 years of age. However, you may need to be screened more often if early forms of precancerous polyps or small growths are found. Skin Cancer  Check your skin from head to toe regularly.  Tell your health care provider about any new moles or changes in moles, especially if there is a change in a mole's shape or color.  Also tell your health care provider if you have a mole that is larger than the size of a pencil eraser.  Always use sunscreen. Apply sunscreen liberally and repeatedly throughout the day.  Protect yourself by wearing long sleeves, pants, a wide-brimmed hat, and sunglasses whenever you are outside. HEART DISEASE, DIABETES, AND HIGH BLOOD PRESSURE   High blood pressure causes heart disease and increases the risk of stroke. High blood pressure is more likely to develop in:  People who have blood pressure in the high end   of the normal range (130-139/85-89 mm Hg).  People who are overweight or obese.  People who are African American.  If you are 38-23 years of age, have your blood pressure checked every 3-5 years. If you are 61 years of age or older, have your blood pressure checked every year. You should have your blood pressure measured twice--once when you are at a hospital or clinic, and once when you are not at a hospital or clinic. Record the average of the two measurements. To check your blood pressure when you are not at a hospital or clinic, you can use:  An automated blood pressure machine at a pharmacy.  A home blood pressure monitor.  If you are between 45 years and 39 years old, ask your health care provider if you should take aspirin to prevent strokes.  Have regular diabetes screenings. This involves taking a blood sample to check your fasting blood sugar level.  If you are at a normal weight and have a low risk for diabetes,  have this test once every three years after 47 years of age.  If you are overweight and have a high risk for diabetes, consider being tested at a younger age or more often. PREVENTING INFECTION  Hepatitis B  If you have a higher risk for hepatitis B, you should be screened for this virus. You are considered at high risk for hepatitis B if:  You were born in a country where hepatitis B is common. Ask your health care provider which countries are considered high risk.  Your parents were born in a high-risk country, and you have not been immunized against hepatitis B (hepatitis B vaccine).  You have HIV or AIDS.  You use needles to inject street drugs.  You live with someone who has hepatitis B.  You have had sex with someone who has hepatitis B.  You get hemodialysis treatment.  You take certain medicines for conditions, including cancer, organ transplantation, and autoimmune conditions. Hepatitis C  Blood testing is recommended for:  Everyone born from [birth year] through 1967.  Anyone with known risk factors for hepatitis C. Sexually transmitted infections (STIs)  You should be screened for sexually transmitted infections (STIs) including gonorrhea and chlamydia if:  You are sexually active and are younger than 47 years of age.  You are older than 47 years of age and your health care provider tells you that you are at risk for this type of infection.  Your sexual activity has changed since you were last screened and you are at an increased risk for chlamydia or gonorrhea. Ask your health care provider if you are at risk.  If you do not have HIV, but are at risk, it may be recommended that you take a prescription medicine daily to prevent HIV infection. This is called pre-exposure prophylaxis (PrEP). You are considered at risk if:  You are sexually active and do not regularly use condoms or know the HIV status of your partner(s).  You take drugs by injection.  You are sexually  active with a partner who has HIV. Talk with your health care provider about whether you are at high risk of being infected with HIV. If you choose to begin PrEP, you should first be tested for HIV. You should then be tested every 3 months for as long as you are taking PrEP.  PREGNANCY   If you are premenopausal and you may become pregnant, ask your health care provider about preconception counseling.  If you may  become pregnant, take 400 to 800 micrograms (mcg) of folic acid every day.  If you want to prevent pregnancy, talk to your health care provider about birth control (contraception). OSTEOPOROSIS AND MENOPAUSE   Osteoporosis is a disease in which the bones lose minerals and strength with aging. This can result in serious bone fractures. Your risk for osteoporosis can be identified using a bone density scan.  If you are 61 years of age or older, or if you are at risk for osteoporosis and fractures, ask your health care provider if you should be screened.  Ask your health care provider whether you should take a calcium or vitamin D supplement to lower your risk for osteoporosis.  Menopause may have certain physical symptoms and risks.  Hormone replacement therapy may reduce some of these symptoms and risks. Talk to your health care provider about whether hormone replacement therapy is right for you.  HOME CARE INSTRUCTIONS   Schedule regular health, dental, and eye exams.  Stay current with your immunizations.   Do not use any tobacco products including cigarettes, chewing tobacco, or electronic cigarettes.  If you are pregnant, do not drink alcohol.  If you are breastfeeding, limit how much and how often you drink alcohol.  Limit alcohol intake to no more than 1 drink per day for nonpregnant women. One drink equals 12 ounces of beer, 5 ounces of wine, or 1 ounces of hard liquor.  Do not use street drugs.  Do not share needles.  Ask your health care provider for help if  you need support or information about quitting drugs.  Tell your health care provider if you often feel depressed.  Tell your health care provider if you have ever been abused or do not feel safe at home.   This information is not intended to replace advice given to you by your health care provider. Make sure you discuss any questions you have with your health care provider.   Document Released: 01/08/2011 Document Revised: 07/16/2014 Document Reviewed: 05/27/2013 Elsevier Interactive Patient Education [employer].

## 2016-01-19 NOTE — Addendum Note (Signed)
Addended by: Burnett Kanaris on: 01/19/2016 09:41 AM   Modules accepted: Orders, SmartSet

## 2016-01-20 LAB — URINALYSIS W MICROSCOPIC + REFLEX CULTURE
Bacteria, UA: NONE SEEN [HPF]
Bilirubin Urine: NEGATIVE
Casts: NONE SEEN [LPF]
Crystals: NONE SEEN [HPF]
Glucose, UA: NEGATIVE
Hgb urine dipstick: NEGATIVE
Ketones, ur: NEGATIVE
Leukocytes, UA: NEGATIVE
Nitrite: NEGATIVE
Protein, ur: NEGATIVE
Specific Gravity, Urine: 1.016 (ref 1.001–1.035)
Yeast: NONE SEEN [HPF]
pH: 7.5 (ref 5.0–8.0)

## 2016-01-21 LAB — URINE CULTURE

## 2016-01-23 LAB — PAP, TP IMAGING W/ HPV RNA, RFLX HPV TYPE 16,18/45: HPV mRNA, High Risk: NOT DETECTED

## 2017-12-06 ENCOUNTER — Other Ambulatory Visit: Payer: Self-pay | Admitting: Physician Assistant

## 2017-12-06 DIAGNOSIS — Z1231 Encounter for screening mammogram for malignant neoplasm of breast: Secondary | ICD-10-CM

## 2018-10-14 ENCOUNTER — Encounter: Payer: BC Managed Care – PPO | Admitting: Women's Health

## 2018-11-25 ENCOUNTER — Other Ambulatory Visit: Payer: Self-pay

## 2018-11-26 ENCOUNTER — Ambulatory Visit (INDEPENDENT_AMBULATORY_CARE_PROVIDER_SITE_OTHER): Payer: BC Managed Care – PPO | Admitting: Women's Health

## 2018-11-26 ENCOUNTER — Encounter: Payer: Self-pay | Admitting: Women's Health

## 2018-11-26 VITALS — BP 126/78 | Ht 66.0 in | Wt 222.0 lb

## 2018-11-26 DIAGNOSIS — Z01419 Encounter for gynecological examination (general) (routine) without abnormal findings: Secondary | ICD-10-CM

## 2018-11-26 NOTE — Patient Instructions (Addendum)
Colonoscopy  [facility]  [phone]  Dr Carlean Purl Vit D3  2000 iu Vit E twice daily  Health Maintenance, Female Adopting a healthy lifestyle and getting preventive care can go a long way to promote health and wellness. Talk with your health care provider about what schedule of regular examinations is right for you. This is a good chance for you to check in with your provider about disease prevention and staying healthy. In between checkups, there are plenty of things you can do on your own. Experts have done a lot of research about which lifestyle changes and preventive measures are most likely to keep you healthy. Ask your health care provider for more information. Weight and diet Eat a healthy diet  Be sure to include plenty of vegetables, fruits, low-fat dairy products, and lean protein.  Do not eat a lot of foods high in solid fats, added sugars, or salt.  Get regular exercise. This is one of the most important things you can do for your health. ? Most adults should exercise for at least 150 minutes each week. The exercise should increase your heart rate and make you sweat (moderate-intensity exercise). ? Most adults should also do strengthening exercises at least twice a week. This is in addition to the moderate-intensity exercise. Maintain a healthy weight  Body mass index (BMI) is a measurement that can be used to identify possible weight problems. It estimates body fat based on height and weight. Your health care provider can help determine your BMI and help you achieve or maintain a healthy weight.  For females 20 years of age and older: ? A BMI below 18.5 is considered underweight. ? A BMI of 18.5 to 24.9 is normal. ? A BMI of 25 to 29.9 is considered overweight. ? A BMI of 30 and above is considered obese. Watch levels of cholesterol and blood lipids  You should start having your blood tested for lipids and cholesterol at 50 years of age, then have this test every 5 years.  You  may need to have your cholesterol levels checked more often if: ? Your lipid or cholesterol levels are high. ? You are older than 50 years of age. ? You are at high risk for heart disease. Cancer screening Lung Cancer  Lung cancer screening is recommended for adults 20-34 years old who are at high risk for lung cancer because of a history of smoking.  A yearly low-dose CT scan of the lungs is recommended for people who: ? Currently smoke. ? Have quit within the past 15 years. ? Have at least a 30-pack-year history of smoking. A pack year is smoking an average of one pack of cigarettes a day for 1 year.  Yearly screening should continue until it has been 15 years since you quit.  Yearly screening should stop if you develop a health problem that would prevent you from having lung cancer treatment. Breast Cancer  Practice breast self-awareness. This means understanding how your breasts normally appear and feel.  It also means doing regular breast self-exams. Let your health care provider know about any changes, no matter how small.  If you are in your 20s or 30s, you should have a clinical breast exam (CBE) by a health care provider every 1-3 years as part of a regular health exam.  If you are 7 or older, have a CBE every year. Also consider having a breast X-ray (mammogram) every year.  If you have a family history of breast cancer, talk  to your health care provider about genetic screening.  If you are at high risk for breast cancer, talk to your health care provider about having an MRI and a mammogram every year.  Breast cancer gene (BRCA) assessment is recommended for women who have family members with BRCA-related cancers. BRCA-related cancers include: ? Breast. ? Ovarian. ? Tubal. ? Peritoneal cancers.  Results of the assessment will determine the need for genetic counseling and BRCA1 and BRCA2 testing. Cervical Cancer Your health care provider may recommend that you be  screened regularly for cancer of the pelvic organs (ovaries, uterus, and vagina). This screening involves a pelvic examination, including checking for microscopic changes to the surface of your cervix (Pap test). You may be encouraged to have this screening done every 3 years, beginning at age 77.  For women ages 52-65, health care providers may recommend pelvic exams and Pap testing every 3 years, or they may recommend the Pap and pelvic exam, combined with testing for human papilloma virus (HPV), every 5 years. Some types of HPV increase your risk of cervical cancer. Testing for HPV may also be done on women of any age with unclear Pap test results.  Other health care providers may not recommend any screening for nonpregnant women who are considered low risk for pelvic cancer and who do not have symptoms. Ask your health care provider if a screening pelvic exam is right for you.  If you have had past treatment for cervical cancer or a condition that could lead to cancer, you need Pap tests and screening for cancer for at least 20 years after your treatment. If Pap tests have been discontinued, your risk factors (such as having a new sexual partner) need to be reassessed to determine if screening should resume. Some women have medical problems that increase the chance of getting cervical cancer. In these cases, your health care provider may recommend more frequent screening and Pap tests. Colorectal Cancer  This type of cancer can be detected and often prevented.  Routine colorectal cancer screening usually begins at 50 years of age and continues through 50 years of age.  Your health care provider may recommend screening at an earlier age if you have risk factors for colon cancer.  Your health care provider may also recommend using home test kits to check for hidden blood in the stool.  A small camera at the end of a tube can be used to examine your colon directly (sigmoidoscopy or colonoscopy).  This is done to check for the earliest forms of colorectal cancer.  Routine screening usually begins at age 70.  Direct examination of the colon should be repeated every 5-10 years through 50 years of age. However, you may need to be screened more often if early forms of precancerous polyps or small growths are found. Skin Cancer  Check your skin from head to toe regularly.  Tell your health care provider about any new moles or changes in moles, especially if there is a change in a mole's shape or color.  Also tell your health care provider if you have a mole that is larger than the size of a pencil eraser.  Always use sunscreen. Apply sunscreen liberally and repeatedly throughout the day.  Protect yourself by wearing long sleeves, pants, a wide-brimmed hat, and sunglasses whenever you are outside. Heart disease, diabetes, and high blood pressure  High blood pressure causes heart disease and increases the risk of stroke. High blood pressure is more likely to develop in: ?  People who have blood pressure in the high end of the normal range (130-139/85-89 mm Hg). ? People who are overweight or obese. ? People who are African American.  If you are 34-56 years of age, have your blood pressure checked every 3-5 years. If you are 74 years of age or older, have your blood pressure checked every year. You should have your blood pressure measured twice-once when you are at a hospital or clinic, and once when you are not at a hospital or clinic. Record the average of the two measurements. To check your blood pressure when you are not at a hospital or clinic, you can use: ? An automated blood pressure machine at a pharmacy. ? A home blood pressure monitor.  If you are between 50 years and 83 years old, ask your health care provider if you should take aspirin to prevent strokes.  Have regular diabetes screenings. This involves taking a blood sample to check your fasting blood sugar level. ? If you  are at a normal weight and have a low risk for diabetes, have this test once every three years after 50 years of age. ? If you are overweight and have a high risk for diabetes, consider being tested at a younger age or more often. Preventing infection Hepatitis B  If you have a higher risk for hepatitis B, you should be screened for this virus. You are considered at high risk for hepatitis B if: ? You were born in a country where hepatitis B is common. Ask your health care provider which countries are considered high risk. ? Your parents were born in a high-risk country, and you have not been immunized against hepatitis B (hepatitis B vaccine). ? You have HIV or AIDS. ? You use needles to inject street drugs. ? You live with someone who has hepatitis B. ? You have had sex with someone who has hepatitis B. ? You get hemodialysis treatment. ? You take certain medicines for conditions, including cancer, organ transplantation, and autoimmune conditions. Hepatitis C  Blood testing is recommended for: ? Everyone born from [birth year] through 1967. ? Anyone with known risk factors for hepatitis C. Sexually transmitted infections (STIs)  You should be screened for sexually transmitted infections (STIs) including gonorrhea and chlamydia if: ? You are sexually active and are younger than 50 years of age. ? You are older than 50 years of age and your health care provider tells you that you are at risk for this type of infection. ? Your sexual activity has changed since you were last screened and you are at an increased risk for chlamydia or gonorrhea. Ask your health care provider if you are at risk.  If you do not have HIV, but are at risk, it may be recommended that you take a prescription medicine daily to prevent HIV infection. This is called pre-exposure prophylaxis (PrEP). You are considered at risk if: ? You are sexually active and do not regularly use condoms or know the HIV status of your  partner(s). ? You take drugs by injection. ? You are sexually active with a partner who has HIV. Talk with your health care provider about whether you are at high risk of being infected with HIV. If you choose to begin PrEP, you should first be tested for HIV. You should then be tested every 3 months for as long as you are taking PrEP. Pregnancy  If you are premenopausal and you may become pregnant, ask your health care provider about  preconception counseling.  If you may become pregnant, take 400 to 800 micrograms (mcg) of folic acid every day.  If you want to prevent pregnancy, talk to your health care provider about birth control (contraception). Osteoporosis and menopause  Osteoporosis is a disease in which the bones lose minerals and strength with aging. This can result in serious bone fractures. Your risk for osteoporosis can be identified using a bone density scan.  If you are 45 years of age or older, or if you are at risk for osteoporosis and fractures, ask your health care provider if you should be screened.  Ask your health care provider whether you should take a calcium or vitamin D supplement to lower your risk for osteoporosis.  Menopause may have certain physical symptoms and risks.  Hormone replacement therapy may reduce some of these symptoms and risks. Talk to your health care provider about whether hormone replacement therapy is right for you. Follow these instructions at home:  Schedule regular health, dental, and eye exams.  Stay current with your immunizations.  Do not use any tobacco products including cigarettes, chewing tobacco, or electronic cigarettes.  If you are pregnant, do not drink alcohol.  If you are breastfeeding, limit how much and how often you drink alcohol.  Limit alcohol intake to no more than 1 drink per day for nonpregnant women. One drink equals 12 ounces of beer, 5 ounces of wine, or 1 ounces of hard liquor.  Do not use street  drugs.  Do not share needles.  Ask your health care provider for help if you need support or information about quitting drugs.  Tell your health care provider if you often feel depressed.  Tell your health care provider if you have ever been abused or do not feel safe at home. This information is not intended to replace advice given to you by your health care provider. Make sure you discuss any questions you have with your health care provider. Document Released: 01/08/2011 Document Revised: 12/01/2015 Document Reviewed: 03/29/2015 Elsevier Interactive Patient Education  2019 [employer].

## 2018-11-26 NOTE — Progress Notes (Signed)
Teresa Fitzpatrick 03-May-1971 [id number]    History:    Presents for annual exam.  Light monthly 2 to 3 days cycles/vasectomy.  2013 endometrial ablation her option.  Denies any menopausal symptoms.  Has had some low pelvic pressure.  Normal Pap and mammogram history.  Last mammogram 2016, has had some tenderness bilaterally without palpable changes or nipple discharge.  Primary care manages hypertension.  Past medical history, past surgical history, family history and social history were all reviewed and documented in the EPIC chart.  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  Parents hypertension.  ROS:  A ROS was performed and pertinent positives and negatives are included.  Exam:  Vitals:   11/26/18 1305  BP: 126/78  Weight: 222 lb (100.7 kg)  Height: 5\' 6"  (1.676 m)   Body mass index is 35.83 kg/m.   General appearance:  Normal Thyroid:  Symmetrical, normal in size, without palpable masses or nodularity. Respiratory  Auscultation:  Clear without wheezing or rhonchi Cardiovascular  Auscultation:  Regular rate, without rubs, murmurs or gallops  Edema/varicosities:  Not grossly evident Abdominal  Soft,nontender, without masses, guarding or rebound.  Liver/spleen:  No organomegaly noted  Hernia:  None appreciated  Skin  Inspection:  Grossly normal   Breasts: Examined lying and sitting.     Right: Without masses, retractions, discharge or axillary adenopathy.     Left: Without masses, retractions, discharge or axillary adenopathy. Gentitourinary   Inguinal/mons:  Normal without inguinal adenopathy  External genitalia:  Normal  BUS/Urethra/Skene's glands:  Normal  Vagina:  Normal  Cervix:  Normal  Uterus: Retroverted normal in size, shape and contour.  Midline and mobile  Adnexa/parametria:     Rt: Without masses or tenderness.   Lt: Without masses or tenderness.  Anus and perineum: Normal  Digital rectal exam: Normal  sphincter tone without palpated masses or tenderness  Assessment/Plan:  48 y.o. MWF G2 P2 for annual exam.     Light monthly cycle/vasectomy 2013 her option Hypertension-primary care manages labs and meds Obesity RA  Plan: Screening colonoscopy reviewed and encouraged, [facility] information given instructed to schedule.  Vitamin E twice daily for breast tenderness, reviewed importance of annual screening 3D mammogram history of dense breasts.  Options of referral for rheumatoid arthritis given declines at this time.  Increase regular cardio type exercise and decrease calorie/carbs encouraged.  Congratulated on 20 pound weight loss since last exam.  Pap with HR HPV typing,    Huel Cote [facility], 2:00 PM 11/26/2018

## 2018-11-26 NOTE — Addendum Note (Signed)
Addended by: Lorine Bears on: 11/26/2018 02:11 PM   Modules accepted: Orders

## 2018-11-28 LAB — PAP, TP IMAGING W/ HPV RNA, RFLX HPV TYPE 16,18/45: HPV DNA High Risk: NOT DETECTED

## 2019-04-07 ENCOUNTER — Other Ambulatory Visit: Payer: Self-pay | Admitting: Physician Assistant

## 2019-04-07 DIAGNOSIS — Z1231 Encounter for screening mammogram for malignant neoplasm of breast: Secondary | ICD-10-CM

## 2019-05-20 ENCOUNTER — Ambulatory Visit
Admission: RE | Admit: 2019-05-20 | Discharge: 2019-05-20 | Disposition: A | Discharge status: Home / Self Care | Payer: BC Managed Care – PPO | Source: Ambulatory Visit | Attending: Physician Assistant | Admitting: Physician Assistant

## 2019-05-20 DIAGNOSIS — Z1231 Encounter for screening mammogram for malignant neoplasm of breast: Secondary | ICD-10-CM | POA: Diagnosis not present

## 2019-05-21 ENCOUNTER — Other Ambulatory Visit: Payer: Self-pay | Admitting: Physician Assistant

## 2019-05-25 ENCOUNTER — Other Ambulatory Visit: Payer: Self-pay | Admitting: Physician Assistant

## 2019-05-25 DIAGNOSIS — R928 Other abnormal and inconclusive findings on diagnostic imaging of breast: Secondary | ICD-10-CM

## 2019-06-03 ENCOUNTER — Ambulatory Visit
Admission: RE | Admit: 2019-06-03 | Discharge: 2019-06-03 | Disposition: A | Discharge status: Home / Self Care | Payer: BC Managed Care – PPO | Source: Ambulatory Visit | Attending: Physician Assistant | Admitting: Physician Assistant

## 2019-06-03 DIAGNOSIS — R928 Other abnormal and inconclusive findings on diagnostic imaging of breast: Secondary | ICD-10-CM

## 2019-06-09 ENCOUNTER — Other Ambulatory Visit: Payer: Self-pay | Admitting: Physician Assistant

## 2019-06-09 DIAGNOSIS — R928 Other abnormal and inconclusive findings on diagnostic imaging of breast: Secondary | ICD-10-CM

## 2019-06-09 DIAGNOSIS — N6489 Other specified disorders of breast: Secondary | ICD-10-CM

## 2019-06-12 ENCOUNTER — Ambulatory Visit
Admission: RE | Admit: 2019-06-12 | Discharge: 2019-06-12 | Disposition: A | Discharge status: Home / Self Care | Payer: BC Managed Care – PPO | Source: Ambulatory Visit | Attending: Physician Assistant | Admitting: Physician Assistant

## 2019-06-12 DIAGNOSIS — R928 Other abnormal and inconclusive findings on diagnostic imaging of breast: Secondary | ICD-10-CM | POA: Insufficient documentation

## 2019-06-12 DIAGNOSIS — N6489 Other specified disorders of breast: Secondary | ICD-10-CM | POA: Insufficient documentation

## 2019-06-12 HISTORY — PX: BREAST BIOPSY: SHX20

## 2019-06-15 LAB — SURGICAL PATHOLOGY

## 2019-09-05 ENCOUNTER — Ambulatory Visit: Payer: BC Managed Care – PPO | Attending: Internal Medicine

## 2019-09-05 DIAGNOSIS — Z23 Encounter for immunization: Secondary | ICD-10-CM | POA: Insufficient documentation

## 2019-09-05 NOTE — Progress Notes (Signed)
   Covid-19 Vaccination Clinic  Name:  Teresa Fitzpatrick    MRN: [id number] DOB: Nov 01, 1970  09/05/2019  [name] was observed post Covid-19 immunization for 15 minutes without incidence. She was provided with Vaccine Information Sheet and instruction to access the V-Safe system.   [name] was instructed to call 911 with any severe reactions post vaccine: Marland Kitchen Difficulty breathing  . Swelling of your face and throat  . A fast heartbeat  . A bad rash all over your body  . Dizziness and weakness    Immunizations Administered    Name Date Dose VIS Date Route   Moderna COVID-19 Vaccine 09/05/2019 12:40 PM 0.5 mL 06/09/2019 Intramuscular   Manufacturer: Moderna   Lot: [id number]   [id number]

## 2019-10-03 ENCOUNTER — Ambulatory Visit: Payer: BC Managed Care – PPO

## 2019-10-06 ENCOUNTER — Ambulatory Visit: Payer: BC Managed Care – PPO

## 2019-10-10 ENCOUNTER — Ambulatory Visit: Payer: BC Managed Care – PPO | Attending: Internal Medicine

## 2019-10-10 DIAGNOSIS — Z23 Encounter for immunization: Secondary | ICD-10-CM

## 2019-10-10 NOTE — Progress Notes (Signed)
   Covid-19 Vaccination Clinic  Name:  Teresa Fitzpatrick    MRN: [id number] DOB: April 18, 1971  10/10/2019  Teresa Fitzpatrick was observed post Covid-19 immunization for 15 minutes without incident. She was provided with Vaccine Information Sheet and instruction to access the V-Safe system.   Teresa Fitzpatrick was instructed to call 911 with any severe reactions post vaccine: Marland Kitchen Difficulty breathing  . Swelling of face and throat  . A fast heartbeat  . A bad rash all over body  . Dizziness and weakness   Immunizations Administered    Name Date Dose VIS Date Route   Moderna COVID-19 Vaccine 10/10/2019  8:41 AM 0.5 mL 06/09/2019 Intramuscular   Manufacturer: Levan Hurst   [id number]   [id number]

## 2019-12-16 ENCOUNTER — Other Ambulatory Visit: Payer: Self-pay

## 2019-12-17 ENCOUNTER — Telehealth: Payer: Self-pay | Admitting: *Deleted

## 2019-12-17 ENCOUNTER — Encounter: Payer: Self-pay | Admitting: Nurse Practitioner

## 2019-12-17 ENCOUNTER — Ambulatory Visit: Payer: BC Managed Care – PPO | Admitting: Nurse Practitioner

## 2019-12-17 VITALS — BP 118/78 | Ht 66.0 in | Wt 218.0 lb

## 2019-12-17 DIAGNOSIS — Z9889 Other specified postprocedural states: Secondary | ICD-10-CM | POA: Diagnosis not present

## 2019-12-17 DIAGNOSIS — Z01419 Encounter for gynecological examination (general) (routine) without abnormal findings: Secondary | ICD-10-CM | POA: Diagnosis not present

## 2019-12-17 DIAGNOSIS — N814 Uterovaginal prolapse, unspecified: Secondary | ICD-10-CM

## 2019-12-17 DIAGNOSIS — N8189 Other female genital prolapse: Secondary | ICD-10-CM

## 2019-12-17 NOTE — Patient Instructions (Addendum)
Perform Kegel exercises hourly Will send referral for PT Avoid movements that put pressure on the lower abdomen   Health Maintenance, Female Adopting a healthy lifestyle and getting preventive care are important in promoting health and wellness. Ask your health care provider about:  The right schedule for you to have regular tests and exams.  Things you can do on your own to prevent diseases and keep yourself healthy. What should I know about diet, weight, and exercise? Eat a healthy diet   Eat a diet that includes plenty of vegetables, fruits, low-fat dairy products, and lean protein.  Do not eat a lot of foods that are high in solid fats, added sugars, or sodium. Maintain a healthy weight Body mass index (BMI) is used to identify weight problems. It estimates body fat based on height and weight. Your health care provider can help determine your BMI and help you achieve or maintain a healthy weight. Get regular exercise Get regular exercise. This is one of the most important things you can do for your health. Most adults should:  Exercise for at least 150 minutes each week. The exercise should increase your heart rate and make you sweat (moderate-intensity exercise).  Do strengthening exercises at least twice a week. This is in addition to the moderate-intensity exercise.  Spend less time sitting. Even light physical activity can be beneficial. Watch cholesterol and blood lipids Have your blood tested for lipids and cholesterol at 51 years of age, then have this test every 5 years. Have your cholesterol levels checked more often if:  Your lipid or cholesterol levels are high.  You are older than 51 years of age.  You are at high risk for heart disease. What should I know about cancer screening? Depending on your health history and family history, you may need to have cancer screening at various ages. This may include screening for:  Breast cancer.  Cervical  cancer.  Colorectal cancer.  Skin cancer.  Lung cancer. What should I know about heart disease, diabetes, and high blood pressure? Blood pressure and heart disease  High blood pressure causes heart disease and increases the risk of stroke. This is more likely to develop in people who have high blood pressure readings, are of African descent, or are overweight.  Have your blood pressure checked: ? Every 3-5 years if you are 71-32 years of age. ? Every year if you are 72 years old or older. Diabetes Have regular diabetes screenings. This checks your fasting blood sugar level. Have the screening done:  Once every three years after age 69 if you are at a normal weight and have a low risk for diabetes.  More often and at a younger age if you are overweight or have a high risk for diabetes. What should I know about preventing infection? Hepatitis B If you have a higher risk for hepatitis B, you should be screened for this virus. Talk with your health care provider to find out if you are at risk for hepatitis B infection. Hepatitis C Testing is recommended for:  Everyone born from [birth year] through 1967.  Anyone with known risk factors for hepatitis C. Sexually transmitted infections (STIs)  Get screened for STIs, including gonorrhea and chlamydia, if: ? You are sexually active and are younger than 51 years of age. ? You are older than 51 years of age and your health care provider tells you that you are at risk for this type of infection. ? Your sexual activity has changed since  you were last screened, and you are at increased risk for chlamydia or gonorrhea. Ask your health care provider if you are at risk.  Ask your health care provider about whether you are at high risk for HIV. Your health care provider may recommend a prescription medicine to help prevent HIV infection. If you choose to take medicine to prevent HIV, you should first get tested for HIV. You should then be tested every 3  months for as long as you are taking the medicine. Pregnancy  If you are about to stop having your period (premenopausal) and you may become pregnant, seek counseling before you get pregnant.  Take 400 to 800 micrograms (mcg) of folic acid every day if you become pregnant.  Ask for birth control (contraception) if you want to prevent pregnancy. Osteoporosis and menopause Osteoporosis is a disease in which the bones lose minerals and strength with aging. This can result in bone fractures. If you are 1 years old or older, or if you are at risk for osteoporosis and fractures, ask your health care provider if you should:  Be screened for bone loss.  Take a calcium or vitamin D supplement to lower your risk of fractures.  Be given hormone replacement therapy (HRT) to treat symptoms of menopause. Follow these instructions at home: Lifestyle  Do not use any products that contain nicotine or tobacco, such as cigarettes, e-cigarettes, and chewing tobacco. If you need help quitting, ask your health care provider.  Do not use street drugs.  Do not share needles.  Ask your health care provider for help if you need support or information about quitting drugs. Alcohol use  Do not drink alcohol if: ? Your health care provider tells you not to drink. ? You are pregnant, may be pregnant, or are planning to become pregnant.  If you drink alcohol: ? Limit how much you use to 0-1 drink a day. ? Limit intake if you are breastfeeding.  Be aware of how much alcohol is in your drink. In the [place] one drink equals one 12 oz bottle of beer (355 mL), one 5 oz glass of wine (148 mL), or one 1 oz glass of hard liquor (44 mL). General instructions  Schedule regular health, dental, and eye exams.  Stay current with your vaccines.  Tell your health care provider if: ? You often feel depressed. ? You have ever been abused or do not feel safe at home. Summary  Adopting a healthy lifestyle and getting  preventive care are important in promoting health and wellness.  Follow your health care provider's instructions about healthy diet, exercising, and getting tested or screened for diseases.  Follow your health care provider's instructions on monitoring your cholesterol and blood pressure. This information is not intended to replace advice given to you by your health care provider. Make sure you discuss any questions you have with your health care provider. Document Revised: 06/18/2018 Document Reviewed: 06/18/2018 Elsevier Patient Education  [place].  Pelvic Organ Prolapse Pelvic organ prolapse is the stretching, bulging, or dropping of pelvic organs into an abnormal position. It happens when the muscles and tissues that surround and support pelvic structures become weak or stretched. Pelvic organ prolapse can involve the:  Vagina (vaginal prolapse).  Uterus (uterine prolapse).  Bladder (cystocele).  Rectum (rectocele).  Intestines (enterocele). When organs other than the vagina are involved, they often bulge into the vagina or protrude from the vagina, depending on how severe the prolapse is. What are the  causes? This condition may be caused by:  Pregnancy, labor, and childbirth.  Past pelvic surgery.  Decreased production of the hormone estrogen associated with menopause.  Consistently lifting more than 50 lb (23 kg).  Obesity.  Long-term inability to pass stool (chronic constipation).  A cough that lasts a long time (chronic).  Buildup of fluid in the abdomen due to certain diseases and other conditions. What are the signs or symptoms? Symptoms of this condition include:  Passing a little urine (loss of bladder control) when you cough, sneeze, strain, and exercise (stress incontinence). This may be worse immediately after childbirth. It may gradually improve over time.  Feeling pressure in your pelvis or vagina. This pressure may increase when you cough or  when you are passing stool.  A bulge that protrudes from the opening of your vagina.  Difficulty passing urine or stool.  Pain in your lower back.  Pain, discomfort, or disinterest in sex.  Repeated bladder infections (urinary tract infections).  Difficulty inserting a tampon. In some people, this condition causes no symptoms. How is this diagnosed? This condition may be diagnosed based on a vaginal and rectal exam. During the exam, you may be asked to cough and strain while you are lying down, sitting, and standing up. Your health care provider will determine if other tests are required, such as bladder function tests. How is this treated? Treatment for this condition may depend on your symptoms. Treatment may include:  Lifestyle changes, such as changes to your diet.  Emptying your bladder at scheduled times (bladder training therapy). This can help reduce or avoid urinary incontinence.  Estrogen. Estrogen may help mild prolapse by increasing the strength and tone of pelvic floor muscles.  Kegel exercises. These may help mild cases of prolapse by strengthening and tightening the muscles of the pelvic floor.  A soft, flexible device that helps support the vaginal walls and keep pelvic organs in place (pessary). This is inserted into your vagina by your health care provider.  Surgery. This is often the only form of treatment for severe prolapse. Follow these instructions at home:  Avoid drinking beverages that contain caffeine or alcohol.  Increase your intake of high-fiber foods. This can help decrease constipation and straining during bowel movements.  Lose weight if recommended by your health care provider.  Wear a sanitary pad or adult diapers if you have urinary incontinence.  Avoid heavy lifting and straining with exercise and work. Do not hold your breath when you perform mild to moderate lifting and exercise activities. Limit your activities as directed by your health  care provider.  Do Kegel exercises as directed by your health care provider. To do this: ? Squeeze your pelvic floor muscles tight. You should feel a tight lift in your rectal area and a tightness in your vaginal area. Keep your stomach, buttocks, and legs relaxed. ? Hold the muscles tight for up to 10 seconds. ? Relax your muscles. ? Repeat this exercise 50 times a day, or as many times as told by your health care provider. Continue to do this exercise for at least 4-6 weeks, or for as long as told by your health care provider.  Take over-the-counter and prescription medicines only as told by your health care provider.  If you have a pessary, take care of it as told by your health care provider.  Keep all follow-up visits as told by your health care provider. This is important. Contact a health care provider if you:  Have  symptoms that interfere with your daily activities or sex life.  Need medicine to help with the discomfort.  Notice bleeding from your vagina that is not related to your period.  Have a fever.  Have pain or bleeding when you urinate.  Have bleeding when you pass stool.  Pass urine when you have sex.  Have chronic constipation.  Have a pessary that falls out.  Have bad smelling vaginal discharge.  Have an unusual, low pain in your abdomen. Summary  Pelvic organ prolapse is the stretching, bulging, or dropping of pelvic organs into an abnormal position. It happens when the muscles and tissues that surround and support pelvic structures become weak or stretched.  When organs other than the vagina are involved, they often bulge into the vagina or protrude from the vagina, depending on how severe the prolapse is.  In most cases, this condition needs to be treated only if it produces symptoms. Treatment may include lifestyle changes, estrogen, Kegel exercises, pessary insertion, or surgery.  Avoid heavy lifting and straining with exercise and work. Do not hold  your breath when you perform mild to moderate lifting and exercise activities. Limit your activities as directed by your health care provider. This information is not intended to replace advice given to you by your health care provider. Make sure you discuss any questions you have with your health care provider. Document Revised: 07/17/2017 Document Reviewed: 07/17/2017 Elsevier Patient Education  2020 [employer].

## 2019-12-17 NOTE — Telephone Encounter (Signed)
-----   Message from Tamela Gammon, NP sent at 12/17/2019  9:23 AM EDT ----- Please send PT referral for pelvic floor weakness/mild uterine prolapse. Thank you

## 2019-12-17 NOTE — Telephone Encounter (Signed)
Referral placed at [facility] they will call to schedule.  °

## 2019-12-17 NOTE — Progress Notes (Signed)
   Teresa Fitzpatrick 1970-10-22 [id number]   History:  49 y.o. MWF G3 P2 presents for annual exam.  Complains of pressure-like discomfort in vagina and urinary urgency with standing.  This only occurs right before she has a cycle.  2013 endometrial ablation-having light, irregular cycles.  Denies menopausal symptoms.  Normal pap history. 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  Hypertension managed by primary care.  Gynecologic History Patient's last menstrual period was 12/13/2019. Period Pattern: (!) Irregular Menstrual Flow: Moderate Menstrual Control: Maxi pad, Tampon Dysmenorrhea: (!) Mild Dysmenorrhea Symptoms: Cramping Contraception: vasectomy Last Pap: 11/26/2018. Results were: normal Last mammogram: 06/03/2019. Results were: negative for atypia and malignancy Last colonoscopy: never   Past medical history, past surgical history, family history and social history were all reviewed and documented in the EPIC chart. 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   ROS:  A ROS was performed and pertinent positives and negatives are included.  Exam:  Vitals:   12/17/19 0836  BP: 118/78  Weight: 218 lb (98.9 kg)  Height: 5\' 6"  (1.676 m)   Body mass index is 35.19 kg/m.  General appearance:  Normal Thyroid:  Symmetrical, normal in size, without palpable masses or nodularity. Respiratory  Auscultation:  Clear without wheezing or rhonchi Cardiovascular  Auscultation:  Regular rate, without rubs, murmurs or gallops  Edema/varicosities:  Not grossly evident Abdominal  Soft,nontender, without masses, guarding or rebound.  Liver/spleen:  No organomegaly noted  Hernia:  None appreciated  Skin  Inspection:  Grossly normal   Breasts: Examined lying and sitting.   Right: Without masses, retractions, discharge or axillary adenopathy.   Left: Without masses,  retractions, discharge or axillary adenopathy. Gentitourinary   Inguinal/mons:  Normal without inguinal adenopathy  External genitalia:  Normal  BUS/Urethra/Skene's glands:  Normal  Vagina:  Atrophic changes, 1+ rectocele, 1+ uterine prolapse  Cervix:  Normal  Uterus:  Retroverted, normal in size, shape and contour.  Midline and mobile  Adnexa/parametria:     Rt: Without masses or tenderness.   Lt: Without masses or tenderness.  Anus and perineum: Normal   Assessment/Plan:  49 y.o. MWF G3 P2 for annual exam.   Well female exam with routine gynecological exam - Education provided on SBEs, importance of preventative screenings, current guidelines, high calcium diet, regular exercise, and multivitamin daily. Cologuard completed with primary care. Recommended colonoscopy screening.   Uterine prolapse - educated on diagnosis and ways to prevent further progression to include Kegel exercises, avoiding activity that puts pressure on the lower abdomen, and pelvic strengthening exercises. Will send referral to PT for pelvic floor weakness  History of endometrial ablation - light, irregular cycles  Follow up in 1 year for annual      [place], 8:40 AM 12/17/2019

## 2019-12-22 NOTE — Telephone Encounter (Signed)
Message left x 1 to call and schedule.

## 2020-01-01 NOTE — Telephone Encounter (Signed)
[place] called left message x 2 now. Encounter will be closed office has reached out to schedule.

## 2020-07-07 ENCOUNTER — Other Ambulatory Visit: Payer: Self-pay | Admitting: Physician Assistant

## 2020-07-07 DIAGNOSIS — Z1231 Encounter for screening mammogram for malignant neoplasm of breast: Secondary | ICD-10-CM

## 2020-08-12 ENCOUNTER — Ambulatory Visit
Admission: RE | Admit: 2020-08-12 | Discharge: 2020-08-12 | Disposition: A | Discharge status: Home / Self Care | Payer: BC Managed Care – PPO | Source: Ambulatory Visit | Attending: Physician Assistant | Admitting: Physician Assistant

## 2020-08-12 ENCOUNTER — Other Ambulatory Visit: Payer: Self-pay

## 2020-08-12 DIAGNOSIS — Z1231 Encounter for screening mammogram for malignant neoplasm of breast: Secondary | ICD-10-CM | POA: Diagnosis not present

## 2021-10-02 ENCOUNTER — Other Ambulatory Visit: Payer: Self-pay | Admitting: Physician Assistant

## 2021-10-02 DIAGNOSIS — Z1231 Encounter for screening mammogram for malignant neoplasm of breast: Secondary | ICD-10-CM

## 2021-11-15 ENCOUNTER — Ambulatory Visit
Admission: RE | Admit: 2021-11-15 | Discharge: 2021-11-15 | Disposition: A | Discharge status: Home / Self Care | Payer: BC Managed Care – PPO | Source: Ambulatory Visit | Attending: Physician Assistant | Admitting: Physician Assistant

## 2021-11-15 DIAGNOSIS — Z1231 Encounter for screening mammogram for malignant neoplasm of breast: Secondary | ICD-10-CM | POA: Diagnosis present

## 2022-02-12 DIAGNOSIS — I10 Essential (primary) hypertension: Secondary | ICD-10-CM | POA: Diagnosis not present

## 2022-02-12 DIAGNOSIS — E7800 Pure hypercholesterolemia, unspecified: Secondary | ICD-10-CM | POA: Diagnosis not present

## 2022-02-12 DIAGNOSIS — N1831 Chronic kidney disease, stage 3a: Secondary | ICD-10-CM | POA: Diagnosis not present

## 2022-02-12 DIAGNOSIS — M19041 Primary osteoarthritis, right hand: Secondary | ICD-10-CM | POA: Diagnosis not present

## 2022-02-12 DIAGNOSIS — M19042 Primary osteoarthritis, left hand: Secondary | ICD-10-CM | POA: Diagnosis not present

## 2022-03-07 DIAGNOSIS — Z111 Encounter for screening for respiratory tuberculosis: Secondary | ICD-10-CM | POA: Diagnosis not present

## 2022-03-09 DIAGNOSIS — Z681 Body mass index (BMI) 19 or less, adult: Secondary | ICD-10-CM | POA: Diagnosis not present

## 2022-03-09 DIAGNOSIS — Z111 Encounter for screening for respiratory tuberculosis: Secondary | ICD-10-CM | POA: Diagnosis not present

## 2022-04-11 DIAGNOSIS — M1712 Unilateral primary osteoarthritis, left knee: Secondary | ICD-10-CM | POA: Diagnosis not present

## 2022-04-25 DIAGNOSIS — J40 Bronchitis, not specified as acute or chronic: Secondary | ICD-10-CM | POA: Diagnosis not present

## 2022-04-30 DIAGNOSIS — J329 Chronic sinusitis, unspecified: Secondary | ICD-10-CM | POA: Diagnosis not present

## 2022-04-30 DIAGNOSIS — J40 Bronchitis, not specified as acute or chronic: Secondary | ICD-10-CM | POA: Diagnosis not present

## 2022-05-14 DIAGNOSIS — M179 Osteoarthritis of knee, unspecified: Secondary | ICD-10-CM | POA: Diagnosis not present

## 2022-05-14 DIAGNOSIS — H6990 Unspecified Eustachian tube disorder, unspecified ear: Secondary | ICD-10-CM | POA: Diagnosis not present

## 2022-05-21 DIAGNOSIS — M1712 Unilateral primary osteoarthritis, left knee: Secondary | ICD-10-CM | POA: Diagnosis not present

## 2022-06-13 DIAGNOSIS — M25562 Pain in left knee: Secondary | ICD-10-CM | POA: Diagnosis not present

## 2022-06-13 DIAGNOSIS — M1712 Unilateral primary osteoarthritis, left knee: Secondary | ICD-10-CM | POA: Diagnosis not present

## 2022-08-20 DIAGNOSIS — I10 Essential (primary) hypertension: Secondary | ICD-10-CM | POA: Diagnosis not present

## 2022-08-20 DIAGNOSIS — M19041 Primary osteoarthritis, right hand: Secondary | ICD-10-CM | POA: Diagnosis not present

## 2022-08-20 DIAGNOSIS — Z1331 Encounter for screening for depression: Secondary | ICD-10-CM | POA: Diagnosis not present

## 2022-08-20 DIAGNOSIS — N1831 Chronic kidney disease, stage 3a: Secondary | ICD-10-CM | POA: Diagnosis not present

## 2022-08-20 DIAGNOSIS — E7800 Pure hypercholesterolemia, unspecified: Secondary | ICD-10-CM | POA: Diagnosis not present

## 2022-08-27 DIAGNOSIS — M1712 Unilateral primary osteoarthritis, left knee: Secondary | ICD-10-CM | POA: Diagnosis not present

## 2022-12-26 DIAGNOSIS — M1712 Unilateral primary osteoarthritis, left knee: Secondary | ICD-10-CM | POA: Diagnosis not present

## 2023-03-15 DIAGNOSIS — N1831 Chronic kidney disease, stage 3a: Secondary | ICD-10-CM | POA: Diagnosis not present

## 2023-03-15 DIAGNOSIS — E7800 Pure hypercholesterolemia, unspecified: Secondary | ICD-10-CM | POA: Diagnosis not present

## 2023-03-15 DIAGNOSIS — M19041 Primary osteoarthritis, right hand: Secondary | ICD-10-CM | POA: Diagnosis not present

## 2023-03-15 DIAGNOSIS — I10 Essential (primary) hypertension: Secondary | ICD-10-CM | POA: Diagnosis not present

## 2023-03-18 ENCOUNTER — Other Ambulatory Visit: Payer: Self-pay | Admitting: Physician Assistant

## 2023-03-18 DIAGNOSIS — Z1231 Encounter for screening mammogram for malignant neoplasm of breast: Secondary | ICD-10-CM

## 2023-03-20 IMAGING — MG MM DIGITAL SCREENING BILAT W/ TOMO AND CAD
8 series · 8 of 24 positions shown · non-contrast
Comparison: Previous exam(s).

CLINICAL DATA: Screening.

EXAM:
DIGITAL SCREENING BILATERAL MAMMOGRAM WITH TOMOSYNTHESIS AND CAD
TECHNIQUE: Bilateral screening digital craniocaudal and mediolateral oblique
mammograms were obtained. Bilateral screening digital breast
tomosynthesis was performed. The images were evaluated with
computer-aided detection.

[L MLO synth-2D]
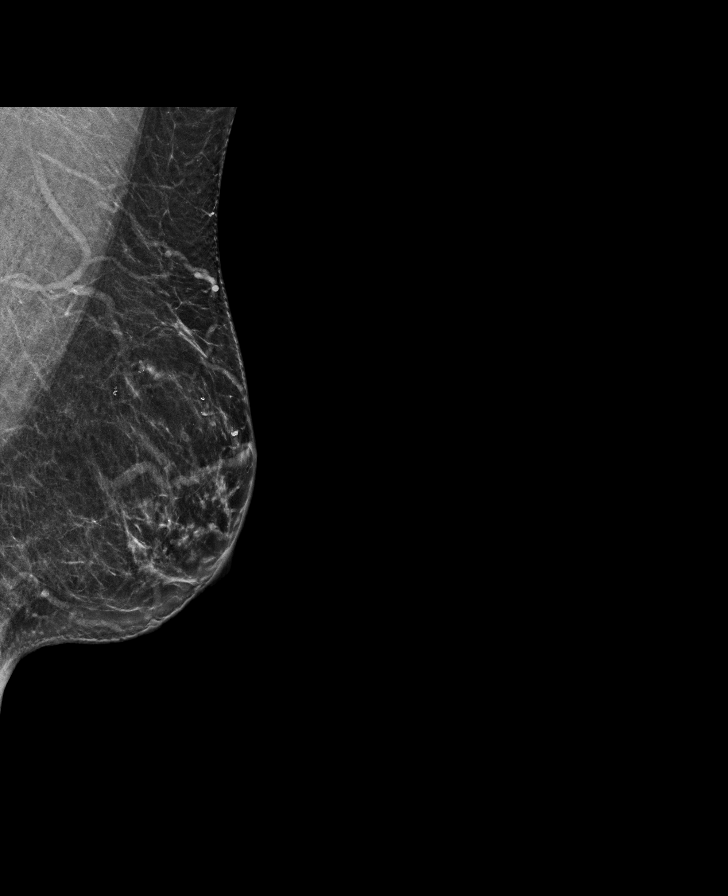

[R CC synth-2D]
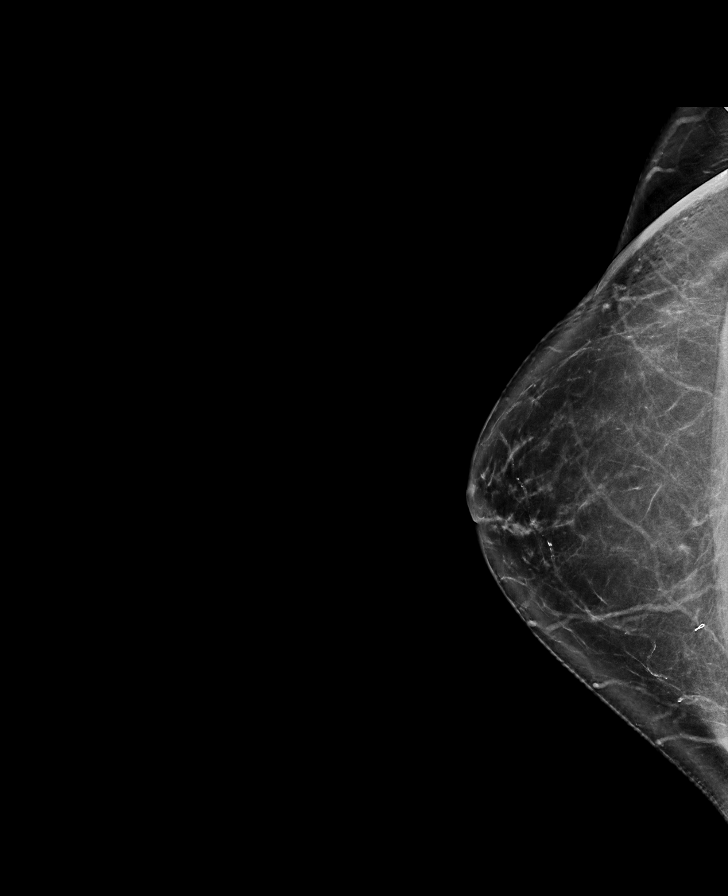

[L CC synth-2D]
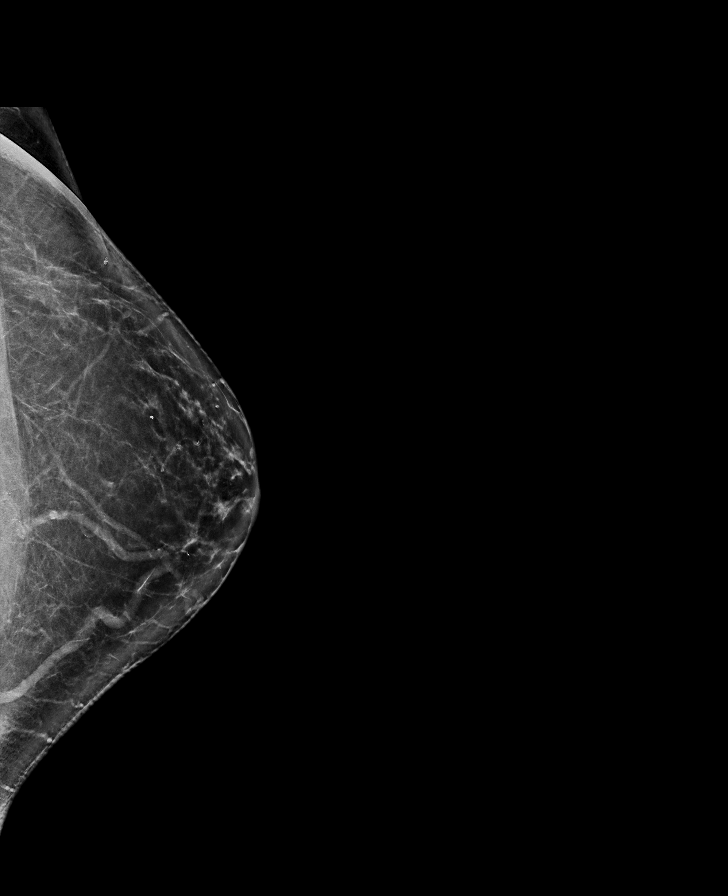

[R MLO synth-2D]
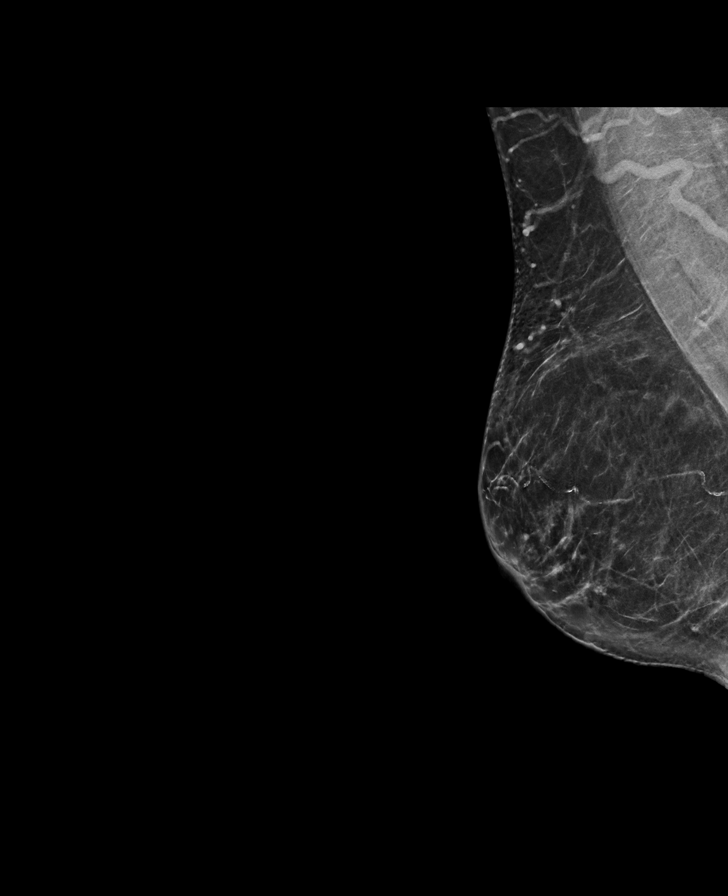

[L CC tomo · tomo slice 36/71.0]
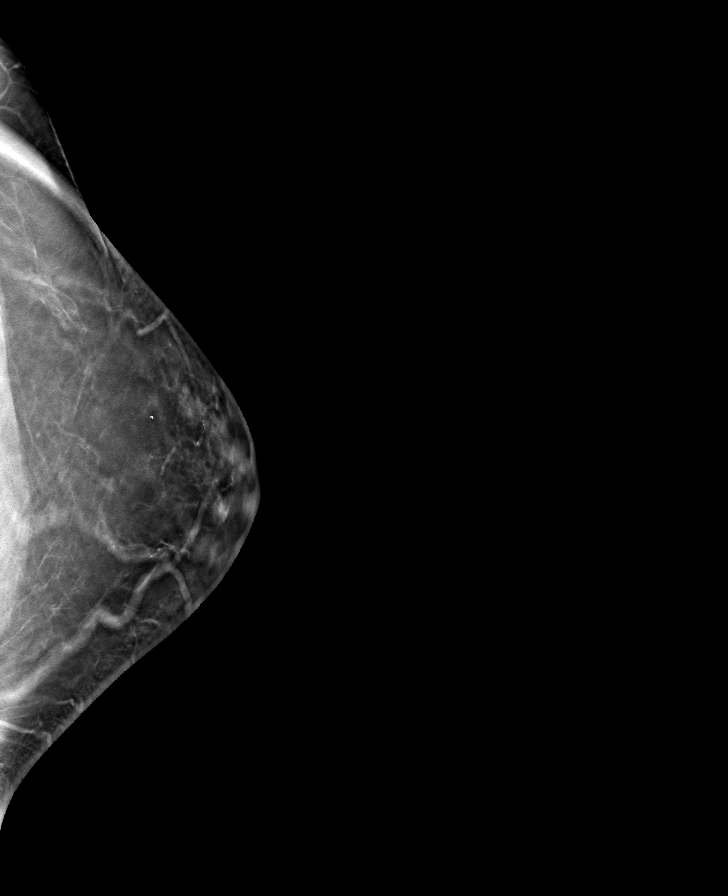

[R MLO tomo · tomo slice 36/71.0]
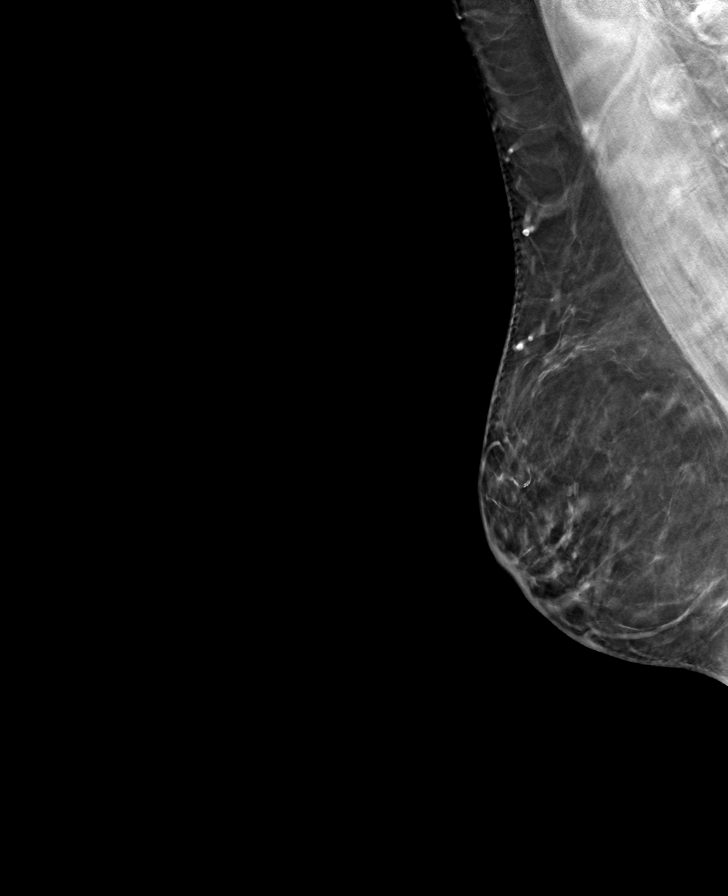

[L MLO tomo · tomo slice 33/65.0]
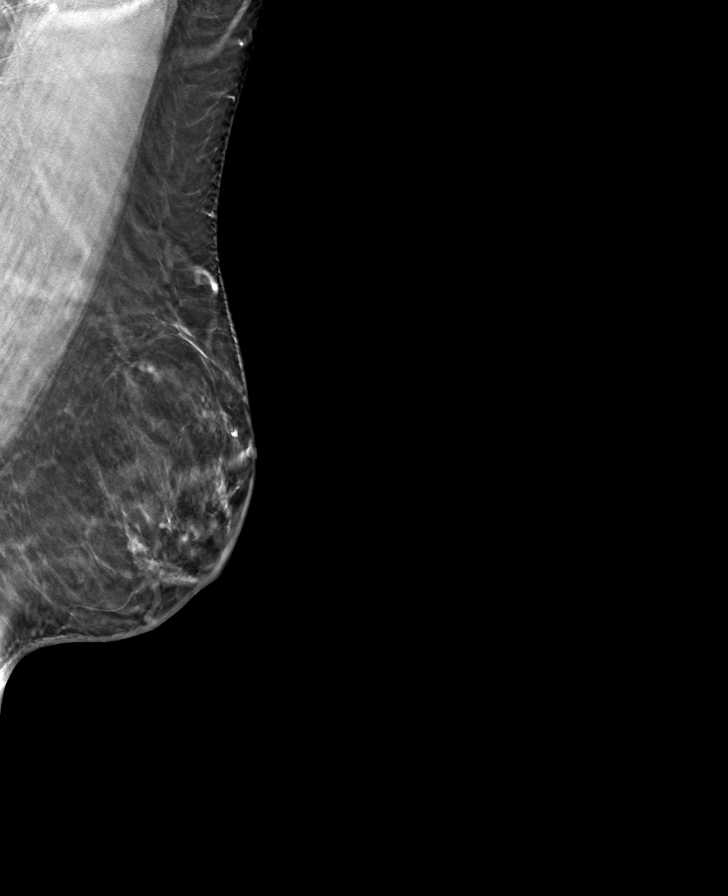

[R CC tomo · tomo slice 38/75.0]
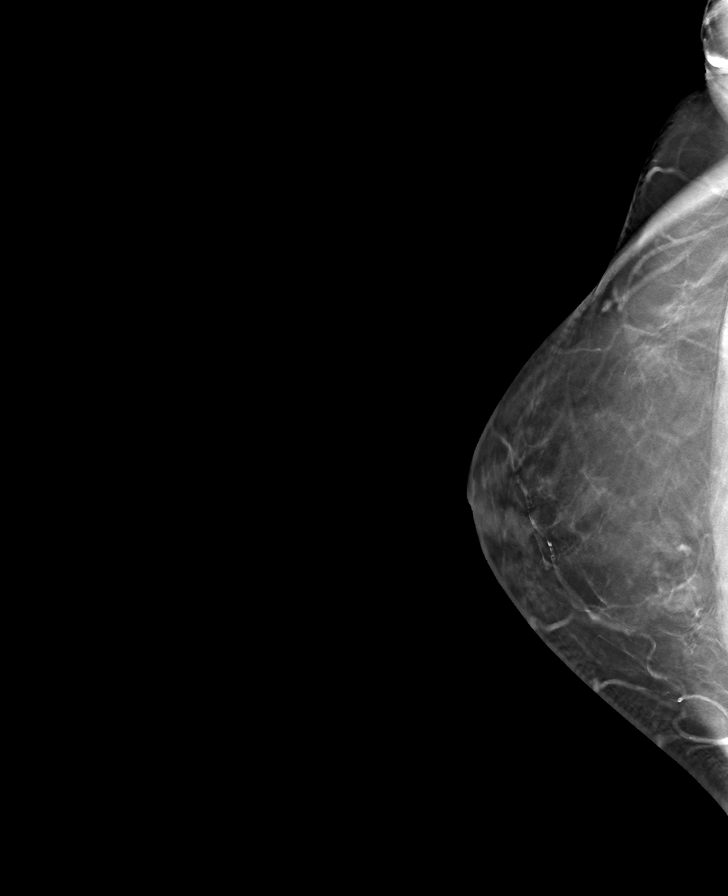

[8 of 24 positions shown; findings below may reference images not displayed]

ACR Breast Density Category b: There are scattered areas of
fibroglandular density.
FINDINGS: There are no findings suspicious for malignancy.
IMPRESSION: No mammographic evidence of malignancy. A result letter of this
screening mammogram will be mailed directly to the patient.

RECOMMENDATION:
Screening mammogram in one year. (Code:51-O-LD2)

BI-RADS CATEGORY  1: Negative.

## 2023-04-11 DIAGNOSIS — Z1211 Encounter for screening for malignant neoplasm of colon: Secondary | ICD-10-CM | POA: Diagnosis not present

## 2023-04-11 DIAGNOSIS — Z1212 Encounter for screening for malignant neoplasm of rectum: Secondary | ICD-10-CM | POA: Diagnosis not present

## 2023-05-03 ENCOUNTER — Encounter: Payer: Self-pay | Admitting: Radiology

## 2023-05-03 ENCOUNTER — Ambulatory Visit
Admission: RE | Admit: 2023-05-03 | Discharge: 2023-05-03 | Disposition: A | Discharge status: Home / Self Care | Payer: BC Managed Care – PPO | Source: Ambulatory Visit | Attending: Physician Assistant | Admitting: Physician Assistant

## 2023-05-03 DIAGNOSIS — Z1231 Encounter for screening mammogram for malignant neoplasm of breast: Secondary | ICD-10-CM | POA: Insufficient documentation

## 2023-10-22 DIAGNOSIS — Z0000 Encounter for general adult medical examination without abnormal findings: Secondary | ICD-10-CM | POA: Diagnosis not present

## 2023-10-22 DIAGNOSIS — Z124 Encounter for screening for malignant neoplasm of cervix: Secondary | ICD-10-CM | POA: Diagnosis not present

## 2023-10-22 DIAGNOSIS — Z1331 Encounter for screening for depression: Secondary | ICD-10-CM | POA: Diagnosis not present

## 2023-10-22 DIAGNOSIS — N912 Amenorrhea, unspecified: Secondary | ICD-10-CM | POA: Diagnosis not present

## 2023-10-22 DIAGNOSIS — E7800 Pure hypercholesterolemia, unspecified: Secondary | ICD-10-CM | POA: Diagnosis not present

## 2023-10-22 DIAGNOSIS — I10 Essential (primary) hypertension: Secondary | ICD-10-CM | POA: Diagnosis not present

## 2024-04-28 DIAGNOSIS — N1831 Chronic kidney disease, stage 3a: Secondary | ICD-10-CM | POA: Diagnosis not present

## 2024-04-28 DIAGNOSIS — R252 Cramp and spasm: Secondary | ICD-10-CM | POA: Diagnosis not present

## 2024-04-28 DIAGNOSIS — E7800 Pure hypercholesterolemia, unspecified: Secondary | ICD-10-CM | POA: Diagnosis not present

## 2024-05-15 DIAGNOSIS — M79605 Pain in left leg: Secondary | ICD-10-CM | POA: Diagnosis not present

## 2024-05-15 DIAGNOSIS — L989 Disorder of the skin and subcutaneous tissue, unspecified: Secondary | ICD-10-CM | POA: Diagnosis not present

## 2024-05-15 DIAGNOSIS — R748 Abnormal levels of other serum enzymes: Secondary | ICD-10-CM | POA: Diagnosis not present
